# Patient Record
Sex: Male | Born: 1940 | Race: Black or African American | Hispanic: No | Marital: Married | Smoking: Never smoker
Health system: Southern US, Community
[De-identification: ages and names within clinical notes are randomized; demographics above are authoritative.]

## PROBLEM LIST (undated history)

## (undated) DIAGNOSIS — G4730 Sleep apnea, unspecified: Secondary | ICD-10-CM

## (undated) DIAGNOSIS — E7800 Pure hypercholesterolemia, unspecified: Secondary | ICD-10-CM

## (undated) DIAGNOSIS — N189 Chronic kidney disease, unspecified: Secondary | ICD-10-CM

## (undated) DIAGNOSIS — I10 Essential (primary) hypertension: Secondary | ICD-10-CM

## (undated) DIAGNOSIS — K219 Gastro-esophageal reflux disease without esophagitis: Secondary | ICD-10-CM

## (undated) DIAGNOSIS — K529 Noninfective gastroenteritis and colitis, unspecified: Secondary | ICD-10-CM

## (undated) HISTORY — DX: Pure hypercholesterolemia, unspecified: E78.00

## (undated) HISTORY — DX: Noninfective gastroenteritis and colitis, unspecified: K52.9

## (undated) HISTORY — PX: APPENDECTOMY: SHX54

## (undated) HISTORY — DX: Essential (primary) hypertension: I10

---

## 1999-11-03 DIAGNOSIS — Z87442 Personal history of urinary calculi: Secondary | ICD-10-CM

## 1999-11-03 HISTORY — DX: Personal history of urinary calculi: Z87.442

## 2002-04-06 ENCOUNTER — Ambulatory Visit (HOSPITAL_COMMUNITY): Admission: RE | Admit: 2002-04-06 | Discharge: 2002-04-06 | Payer: Self-pay | Admitting: Cardiology

## 2006-11-08 ENCOUNTER — Ambulatory Visit: Payer: Self-pay | Admitting: Cardiology

## 2010-07-24 ENCOUNTER — Encounter: Admission: RE | Admit: 2010-07-24 | Discharge: 2010-07-24 | Payer: Self-pay | Admitting: Neurosurgery

## 2011-03-20 NOTE — Cardiovascular Report (Signed)
[place]  Patient:    QUADIR, MUNS Visit Number: [id number] MRN: [id number]          Service Type: CAT Location: [facility] 2855 [id number] Attending Physician:  Jonelle Sidle Dictated by:   Jonelle Sidle, M.D. [facility] Proc. Date: 04/06/02 Admit Date:  04/06/2002 Discharge Date: 04/06/2002                          Cardiac Catheterization  DATE OF BIRTH: 10-Feb-1941  PRIMARY CARE PHYSICIAN: Colon Flattery. Ollen Bowl, M.D.  CARDIOLOGIST: Jonelle Sidle, M.D.  INDICATIONS: The patient is a 70 year old male who presents with a several week history of dyspnea on exertion and intermittent chest discomfort. He apparently was admitted to [facility] with similar symptoms recently and had, by report, a normal myocardial perfusion study with ejection fraction of 68% and chest x-ray showing mild cardiomegaly with scattered areas of lung scarring. He has had continued symptoms despite this evaluation and is now referred for left and right heart catheterization to clearly define the coronary anatomy, left ventricular contraction and hemodynamics.  PROCEDURES PERFORMED: 1. Left heart catheterization. 2. Left ventriculography. 3. Right heart catheterization with hemodynamic measurements. 4. Selective coronary angiography.  ACCESS AND EQUIPMENT: The area about the right femoral artery and vein was anesthetized with 1% lidocaine and a 6 French sheath was placed in the right femoral artery via the modified Seldinger technique. An 8 French sheath was placed in the right femoral vein via the modified Seldinger technique. Standard preformed 6 [place] and JR4 catheters were used for selective coronary angiography. A 6 French angled pigtail catheter was used for left heart catheterization and left ventriculography. A standard 7.5 French balloon-tipped flow-directed catheter was used for right heart catheterization and hemodynamics.  Exchanges were made over a wire. The patient tolerated the procedure well without complications.  HEMODYNAMICS: Right atrium 8, right ventricle 32.11, pulmonary artery 33/13, pulmonary capillary wedge pressure 9 with an A wave to 12 and a V wave without 11. Left ventricle 118/14, aorta 118/72. Cardiac output 4.4 by the Fick method, cardiac index 2.1 by the Fick method. Arterial saturation 94% on room air. Pulmonary artery saturation 66%.  ANGIOGRAPHIC FINDINGS: 1. The left main coronary artery is free of significant flow-limiting coronary    atherosclerosis. 2. The left anterior descending provides two diagonal branches, both of    which are small. There is a 40% ostial narrowing of the first diagonal    branch. No significant flow-limiting lesions are noted. 3. Circumflex coronary artery provides essentially two obtuse marginal    branches. There is a higher ramus intermedius branch as well. No    significant flow-limiting coronary atherosclerosis is noted throughout this    system. 4. The right coronary artery is a dominant vessel that provides the posterior    descending branch. No significant flow-limiting coronary atherosclerosis    is noted in this system.  LEFT VENTRICULOGRAM: Left ventriculography in the RAO position reveals an ejection fraction of approximately 70% with no focal wall motion abnormalities and no significant mitral regurgitation.  DIAGNOSES: 1. Minor coronary atherosclerosis as outlined. 2. Normal hemodynamics. 3. Left ventricular ejection fraction of approximately 70% with no    mitral regurgitation.  RECOMMENDATIONS: Will continue risk factor modification. Based on these findings, there does not appear to be a cardiac etiology for the patients symptoms. Will make a referral for formal pulmonary evaluation. Dictated by:   Remi Deter  Franky Macho, M.D. [facility] Attending Physician:  Jonelle Sidle DD:  04/06/02 TD:  04/08/02 Job: [id number]

## 2015-11-19 DIAGNOSIS — Z6829 Body mass index (BMI) 29.0-29.9, adult: Secondary | ICD-10-CM | POA: Diagnosis not present

## 2015-11-19 DIAGNOSIS — I10 Essential (primary) hypertension: Secondary | ICD-10-CM | POA: Diagnosis not present

## 2015-11-19 DIAGNOSIS — Z0000 Encounter for general adult medical examination without abnormal findings: Secondary | ICD-10-CM | POA: Diagnosis not present

## 2015-12-10 DIAGNOSIS — E7800 Pure hypercholesterolemia, unspecified: Secondary | ICD-10-CM | POA: Diagnosis not present

## 2015-12-10 DIAGNOSIS — K5730 Diverticulosis of large intestine without perforation or abscess without bleeding: Secondary | ICD-10-CM | POA: Diagnosis not present

## 2015-12-10 DIAGNOSIS — R1011 Right upper quadrant pain: Secondary | ICD-10-CM | POA: Diagnosis not present

## 2015-12-10 DIAGNOSIS — K219 Gastro-esophageal reflux disease without esophagitis: Secondary | ICD-10-CM | POA: Diagnosis not present

## 2015-12-10 DIAGNOSIS — Z79899 Other long term (current) drug therapy: Secondary | ICD-10-CM | POA: Diagnosis not present

## 2015-12-10 DIAGNOSIS — K529 Noninfective gastroenteritis and colitis, unspecified: Secondary | ICD-10-CM | POA: Diagnosis not present

## 2015-12-10 DIAGNOSIS — I10 Essential (primary) hypertension: Secondary | ICD-10-CM | POA: Diagnosis not present

## 2016-01-06 DIAGNOSIS — Z8042 Family history of malignant neoplasm of prostate: Secondary | ICD-10-CM | POA: Diagnosis not present

## 2016-01-06 DIAGNOSIS — R350 Frequency of micturition: Secondary | ICD-10-CM | POA: Diagnosis not present

## 2016-01-10 DIAGNOSIS — Z6829 Body mass index (BMI) 29.0-29.9, adult: Secondary | ICD-10-CM | POA: Diagnosis not present

## 2016-01-10 DIAGNOSIS — K210 Gastro-esophageal reflux disease with esophagitis: Secondary | ICD-10-CM | POA: Diagnosis not present

## 2016-01-10 DIAGNOSIS — I10 Essential (primary) hypertension: Secondary | ICD-10-CM | POA: Diagnosis not present

## 2016-01-10 DIAGNOSIS — N3941 Urge incontinence: Secondary | ICD-10-CM | POA: Diagnosis not present

## 2016-01-10 DIAGNOSIS — N401 Enlarged prostate with lower urinary tract symptoms: Secondary | ICD-10-CM | POA: Diagnosis not present

## 2016-01-23 DIAGNOSIS — L128 Other pemphigoid: Secondary | ICD-10-CM | POA: Diagnosis not present

## 2016-01-23 DIAGNOSIS — L280 Lichen simplex chronicus: Secondary | ICD-10-CM | POA: Diagnosis not present

## 2016-01-23 DIAGNOSIS — D485 Neoplasm of uncertain behavior of skin: Secondary | ICD-10-CM | POA: Diagnosis not present

## 2016-01-23 DIAGNOSIS — L308 Other specified dermatitis: Secondary | ICD-10-CM | POA: Diagnosis not present

## 2016-01-23 DIAGNOSIS — L299 Pruritus, unspecified: Secondary | ICD-10-CM | POA: Diagnosis not present

## 2016-01-23 DIAGNOSIS — R238 Other skin changes: Secondary | ICD-10-CM | POA: Diagnosis not present

## 2016-01-28 DIAGNOSIS — S80821A Blister (nonthermal), right lower leg, initial encounter: Secondary | ICD-10-CM | POA: Diagnosis not present

## 2016-01-28 DIAGNOSIS — D485 Neoplasm of uncertain behavior of skin: Secondary | ICD-10-CM | POA: Diagnosis not present

## 2016-02-06 DIAGNOSIS — Z79899 Other long term (current) drug therapy: Secondary | ICD-10-CM | POA: Diagnosis not present

## 2016-02-06 DIAGNOSIS — L120 Bullous pemphigoid: Secondary | ICD-10-CM | POA: Diagnosis not present

## 2016-02-06 DIAGNOSIS — L299 Pruritus, unspecified: Secondary | ICD-10-CM | POA: Diagnosis not present

## 2016-02-26 DIAGNOSIS — L299 Pruritus, unspecified: Secondary | ICD-10-CM | POA: Diagnosis not present

## 2016-02-26 DIAGNOSIS — L120 Bullous pemphigoid: Secondary | ICD-10-CM | POA: Diagnosis not present

## 2016-02-26 DIAGNOSIS — Z79899 Other long term (current) drug therapy: Secondary | ICD-10-CM | POA: Diagnosis not present

## 2016-03-11 DIAGNOSIS — R350 Frequency of micturition: Secondary | ICD-10-CM | POA: Diagnosis not present

## 2016-03-11 DIAGNOSIS — R3915 Urgency of urination: Secondary | ICD-10-CM | POA: Diagnosis not present

## 2016-03-25 DIAGNOSIS — L120 Bullous pemphigoid: Secondary | ICD-10-CM | POA: Diagnosis not present

## 2016-03-25 DIAGNOSIS — L299 Pruritus, unspecified: Secondary | ICD-10-CM | POA: Diagnosis not present

## 2016-03-25 DIAGNOSIS — Z79899 Other long term (current) drug therapy: Secondary | ICD-10-CM | POA: Diagnosis not present

## 2016-03-25 DIAGNOSIS — L218 Other seborrheic dermatitis: Secondary | ICD-10-CM | POA: Diagnosis not present

## 2016-04-09 DIAGNOSIS — Z79899 Other long term (current) drug therapy: Secondary | ICD-10-CM | POA: Diagnosis not present

## 2016-04-09 DIAGNOSIS — L120 Bullous pemphigoid: Secondary | ICD-10-CM | POA: Diagnosis not present

## 2016-04-10 DIAGNOSIS — Z79899 Other long term (current) drug therapy: Secondary | ICD-10-CM | POA: Diagnosis not present

## 2016-04-24 DIAGNOSIS — N3941 Urge incontinence: Secondary | ICD-10-CM | POA: Diagnosis not present

## 2016-04-24 DIAGNOSIS — K210 Gastro-esophageal reflux disease with esophagitis: Secondary | ICD-10-CM | POA: Diagnosis not present

## 2016-04-24 DIAGNOSIS — N401 Enlarged prostate with lower urinary tract symptoms: Secondary | ICD-10-CM | POA: Diagnosis not present

## 2016-04-24 DIAGNOSIS — N183 Chronic kidney disease, stage 3 (moderate): Secondary | ICD-10-CM | POA: Diagnosis not present

## 2016-04-24 DIAGNOSIS — I10 Essential (primary) hypertension: Secondary | ICD-10-CM | POA: Diagnosis not present

## 2016-04-24 DIAGNOSIS — Z6829 Body mass index (BMI) 29.0-29.9, adult: Secondary | ICD-10-CM | POA: Diagnosis not present

## 2016-04-29 DIAGNOSIS — N183 Chronic kidney disease, stage 3 (moderate): Secondary | ICD-10-CM | POA: Diagnosis not present

## 2016-04-29 DIAGNOSIS — I10 Essential (primary) hypertension: Secondary | ICD-10-CM | POA: Diagnosis not present

## 2016-05-11 DIAGNOSIS — L299 Pruritus, unspecified: Secondary | ICD-10-CM | POA: Diagnosis not present

## 2016-05-11 DIAGNOSIS — Z79899 Other long term (current) drug therapy: Secondary | ICD-10-CM | POA: Diagnosis not present

## 2016-05-11 DIAGNOSIS — L128 Other pemphigoid: Secondary | ICD-10-CM | POA: Diagnosis not present

## 2016-05-25 DIAGNOSIS — R51 Headache: Secondary | ICD-10-CM | POA: Diagnosis not present

## 2016-05-25 DIAGNOSIS — A419 Sepsis, unspecified organism: Secondary | ICD-10-CM | POA: Diagnosis not present

## 2016-05-25 DIAGNOSIS — R509 Fever, unspecified: Secondary | ICD-10-CM | POA: Diagnosis not present

## 2016-05-25 DIAGNOSIS — N3000 Acute cystitis without hematuria: Secondary | ICD-10-CM | POA: Diagnosis not present

## 2016-05-25 DIAGNOSIS — E785 Hyperlipidemia, unspecified: Secondary | ICD-10-CM | POA: Diagnosis not present

## 2016-05-25 DIAGNOSIS — J9811 Atelectasis: Secondary | ICD-10-CM | POA: Diagnosis not present

## 2016-05-25 DIAGNOSIS — N390 Urinary tract infection, site not specified: Secondary | ICD-10-CM | POA: Diagnosis not present

## 2016-05-25 DIAGNOSIS — N189 Chronic kidney disease, unspecified: Secondary | ICD-10-CM | POA: Diagnosis not present

## 2016-05-25 DIAGNOSIS — R109 Unspecified abdominal pain: Secondary | ICD-10-CM | POA: Diagnosis not present

## 2016-05-25 DIAGNOSIS — R3915 Urgency of urination: Secondary | ICD-10-CM | POA: Diagnosis not present

## 2016-05-25 DIAGNOSIS — E7800 Pure hypercholesterolemia, unspecified: Secondary | ICD-10-CM | POA: Diagnosis not present

## 2016-05-25 DIAGNOSIS — I129 Hypertensive chronic kidney disease with stage 1 through stage 4 chronic kidney disease, or unspecified chronic kidney disease: Secondary | ICD-10-CM | POA: Diagnosis not present

## 2016-05-25 DIAGNOSIS — E869 Volume depletion, unspecified: Secondary | ICD-10-CM | POA: Diagnosis not present

## 2016-05-25 DIAGNOSIS — E669 Obesity, unspecified: Secondary | ICD-10-CM | POA: Diagnosis not present

## 2016-05-25 DIAGNOSIS — R413 Other amnesia: Secondary | ICD-10-CM | POA: Diagnosis not present

## 2016-05-25 DIAGNOSIS — I2699 Other pulmonary embolism without acute cor pulmonale: Secondary | ICD-10-CM | POA: Diagnosis not present

## 2016-05-25 DIAGNOSIS — N400 Enlarged prostate without lower urinary tract symptoms: Secondary | ICD-10-CM | POA: Diagnosis not present

## 2016-05-25 DIAGNOSIS — R410 Disorientation, unspecified: Secondary | ICD-10-CM | POA: Diagnosis not present

## 2016-05-25 DIAGNOSIS — Z6828 Body mass index (BMI) 28.0-28.9, adult: Secondary | ICD-10-CM | POA: Diagnosis not present

## 2016-05-25 DIAGNOSIS — R0602 Shortness of breath: Secondary | ICD-10-CM | POA: Diagnosis not present

## 2016-05-25 DIAGNOSIS — R600 Localized edema: Secondary | ICD-10-CM | POA: Diagnosis not present

## 2016-05-25 DIAGNOSIS — N281 Cyst of kidney, acquired: Secondary | ICD-10-CM | POA: Diagnosis not present

## 2016-05-25 DIAGNOSIS — I517 Cardiomegaly: Secondary | ICD-10-CM | POA: Diagnosis not present

## 2016-05-26 DIAGNOSIS — N281 Cyst of kidney, acquired: Secondary | ICD-10-CM | POA: Diagnosis not present

## 2016-05-26 DIAGNOSIS — R51 Headache: Secondary | ICD-10-CM | POA: Diagnosis not present

## 2016-05-26 DIAGNOSIS — R0602 Shortness of breath: Secondary | ICD-10-CM | POA: Diagnosis not present

## 2016-05-27 DIAGNOSIS — I517 Cardiomegaly: Secondary | ICD-10-CM | POA: Diagnosis not present

## 2016-05-28 DIAGNOSIS — N390 Urinary tract infection, site not specified: Secondary | ICD-10-CM | POA: Diagnosis not present

## 2016-05-28 DIAGNOSIS — R600 Localized edema: Secondary | ICD-10-CM | POA: Diagnosis not present

## 2016-05-28 DIAGNOSIS — R3915 Urgency of urination: Secondary | ICD-10-CM | POA: Diagnosis not present

## 2016-06-02 DIAGNOSIS — I10 Essential (primary) hypertension: Secondary | ICD-10-CM | POA: Diagnosis not present

## 2016-06-02 DIAGNOSIS — I2699 Other pulmonary embolism without acute cor pulmonale: Secondary | ICD-10-CM | POA: Diagnosis not present

## 2016-06-02 DIAGNOSIS — N3001 Acute cystitis with hematuria: Secondary | ICD-10-CM | POA: Diagnosis not present

## 2016-06-02 DIAGNOSIS — N401 Enlarged prostate with lower urinary tract symptoms: Secondary | ICD-10-CM | POA: Diagnosis not present

## 2016-06-02 DIAGNOSIS — K210 Gastro-esophageal reflux disease with esophagitis: Secondary | ICD-10-CM | POA: Diagnosis not present

## 2016-06-02 DIAGNOSIS — Z6828 Body mass index (BMI) 28.0-28.9, adult: Secondary | ICD-10-CM | POA: Diagnosis not present

## 2016-06-02 DIAGNOSIS — N183 Chronic kidney disease, stage 3 (moderate): Secondary | ICD-10-CM | POA: Diagnosis not present

## 2016-06-08 DIAGNOSIS — R079 Chest pain, unspecified: Secondary | ICD-10-CM | POA: Diagnosis not present

## 2016-06-08 DIAGNOSIS — N189 Chronic kidney disease, unspecified: Secondary | ICD-10-CM | POA: Diagnosis not present

## 2016-06-08 DIAGNOSIS — R0789 Other chest pain: Secondary | ICD-10-CM | POA: Diagnosis not present

## 2016-06-08 DIAGNOSIS — K219 Gastro-esophageal reflux disease without esophagitis: Secondary | ICD-10-CM | POA: Diagnosis not present

## 2016-06-08 DIAGNOSIS — E785 Hyperlipidemia, unspecified: Secondary | ICD-10-CM | POA: Diagnosis not present

## 2016-06-08 DIAGNOSIS — Z79899 Other long term (current) drug therapy: Secondary | ICD-10-CM | POA: Diagnosis not present

## 2016-06-08 DIAGNOSIS — N182 Chronic kidney disease, stage 2 (mild): Secondary | ICD-10-CM | POA: Diagnosis not present

## 2016-06-08 DIAGNOSIS — I129 Hypertensive chronic kidney disease with stage 1 through stage 4 chronic kidney disease, or unspecified chronic kidney disease: Secondary | ICD-10-CM | POA: Diagnosis not present

## 2016-06-08 DIAGNOSIS — R0602 Shortness of breath: Secondary | ICD-10-CM | POA: Diagnosis not present

## 2016-06-08 DIAGNOSIS — Z7902 Long term (current) use of antithrombotics/antiplatelets: Secondary | ICD-10-CM | POA: Diagnosis not present

## 2016-06-08 DIAGNOSIS — E784 Other hyperlipidemia: Secondary | ICD-10-CM | POA: Diagnosis not present

## 2016-06-08 DIAGNOSIS — I2699 Other pulmonary embolism without acute cor pulmonale: Secondary | ICD-10-CM | POA: Diagnosis not present

## 2016-06-09 DIAGNOSIS — N182 Chronic kidney disease, stage 2 (mild): Secondary | ICD-10-CM | POA: Diagnosis not present

## 2016-06-09 DIAGNOSIS — K219 Gastro-esophageal reflux disease without esophagitis: Secondary | ICD-10-CM | POA: Diagnosis not present

## 2016-06-09 DIAGNOSIS — R0789 Other chest pain: Secondary | ICD-10-CM | POA: Diagnosis not present

## 2016-06-09 DIAGNOSIS — E784 Other hyperlipidemia: Secondary | ICD-10-CM | POA: Diagnosis not present

## 2016-06-19 DIAGNOSIS — L2389 Allergic contact dermatitis due to other agents: Secondary | ICD-10-CM | POA: Diagnosis not present

## 2016-06-19 DIAGNOSIS — I10 Essential (primary) hypertension: Secondary | ICD-10-CM | POA: Diagnosis not present

## 2016-06-19 DIAGNOSIS — N183 Chronic kidney disease, stage 3 (moderate): Secondary | ICD-10-CM | POA: Diagnosis not present

## 2016-06-19 DIAGNOSIS — Z6828 Body mass index (BMI) 28.0-28.9, adult: Secondary | ICD-10-CM | POA: Diagnosis not present

## 2016-06-19 DIAGNOSIS — I2782 Chronic pulmonary embolism: Secondary | ICD-10-CM | POA: Diagnosis not present

## 2016-06-29 DIAGNOSIS — R0600 Dyspnea, unspecified: Secondary | ICD-10-CM | POA: Diagnosis not present

## 2016-06-29 DIAGNOSIS — R079 Chest pain, unspecified: Secondary | ICD-10-CM | POA: Diagnosis not present

## 2016-06-29 DIAGNOSIS — D649 Anemia, unspecified: Secondary | ICD-10-CM | POA: Diagnosis not present

## 2016-06-29 DIAGNOSIS — J189 Pneumonia, unspecified organism: Secondary | ICD-10-CM | POA: Diagnosis not present

## 2016-06-29 DIAGNOSIS — J019 Acute sinusitis, unspecified: Secondary | ICD-10-CM | POA: Diagnosis not present

## 2016-06-29 DIAGNOSIS — N19 Unspecified kidney failure: Secondary | ICD-10-CM | POA: Diagnosis not present

## 2016-06-29 DIAGNOSIS — J029 Acute pharyngitis, unspecified: Secondary | ICD-10-CM | POA: Diagnosis not present

## 2016-06-29 DIAGNOSIS — Z0131 Encounter for examination of blood pressure with abnormal findings: Secondary | ICD-10-CM | POA: Diagnosis not present

## 2016-06-29 DIAGNOSIS — R0602 Shortness of breath: Secondary | ICD-10-CM | POA: Diagnosis not present

## 2016-06-29 DIAGNOSIS — J209 Acute bronchitis, unspecified: Secondary | ICD-10-CM | POA: Diagnosis not present

## 2016-07-02 DIAGNOSIS — R0600 Dyspnea, unspecified: Secondary | ICD-10-CM | POA: Diagnosis not present

## 2016-07-02 DIAGNOSIS — R079 Chest pain, unspecified: Secondary | ICD-10-CM | POA: Diagnosis not present

## 2016-07-02 DIAGNOSIS — R0602 Shortness of breath: Secondary | ICD-10-CM | POA: Diagnosis not present

## 2016-08-10 DIAGNOSIS — R69 Illness, unspecified: Secondary | ICD-10-CM | POA: Diagnosis not present

## 2016-08-13 DIAGNOSIS — N183 Chronic kidney disease, stage 3 (moderate): Secondary | ICD-10-CM | POA: Diagnosis not present

## 2016-08-13 DIAGNOSIS — Z6828 Body mass index (BMI) 28.0-28.9, adult: Secondary | ICD-10-CM | POA: Diagnosis not present

## 2016-08-13 DIAGNOSIS — I10 Essential (primary) hypertension: Secondary | ICD-10-CM | POA: Diagnosis not present

## 2016-08-13 DIAGNOSIS — K251 Acute gastric ulcer with perforation: Secondary | ICD-10-CM | POA: Diagnosis not present

## 2016-08-13 DIAGNOSIS — I2782 Chronic pulmonary embolism: Secondary | ICD-10-CM | POA: Diagnosis not present

## 2016-09-09 DIAGNOSIS — Z6828 Body mass index (BMI) 28.0-28.9, adult: Secondary | ICD-10-CM | POA: Diagnosis not present

## 2016-09-09 DIAGNOSIS — I208 Other forms of angina pectoris: Secondary | ICD-10-CM | POA: Diagnosis not present

## 2016-09-15 DIAGNOSIS — I209 Angina pectoris, unspecified: Secondary | ICD-10-CM | POA: Diagnosis not present

## 2016-09-15 DIAGNOSIS — I208 Other forms of angina pectoris: Secondary | ICD-10-CM | POA: Diagnosis not present

## 2016-11-13 DIAGNOSIS — I10 Essential (primary) hypertension: Secondary | ICD-10-CM | POA: Diagnosis not present

## 2016-11-13 DIAGNOSIS — I208 Other forms of angina pectoris: Secondary | ICD-10-CM | POA: Diagnosis not present

## 2016-11-13 DIAGNOSIS — N182 Chronic kidney disease, stage 2 (mild): Secondary | ICD-10-CM | POA: Diagnosis not present

## 2016-11-13 DIAGNOSIS — R0609 Other forms of dyspnea: Secondary | ICD-10-CM | POA: Diagnosis not present

## 2016-11-13 DIAGNOSIS — Z6829 Body mass index (BMI) 29.0-29.9, adult: Secondary | ICD-10-CM | POA: Diagnosis not present

## 2016-11-16 DIAGNOSIS — N182 Chronic kidney disease, stage 2 (mild): Secondary | ICD-10-CM | POA: Diagnosis not present

## 2016-11-23 DIAGNOSIS — R1084 Generalized abdominal pain: Secondary | ICD-10-CM | POA: Diagnosis not present

## 2016-12-10 DIAGNOSIS — I10 Essential (primary) hypertension: Secondary | ICD-10-CM | POA: Diagnosis not present

## 2016-12-10 DIAGNOSIS — Z7982 Long term (current) use of aspirin: Secondary | ICD-10-CM | POA: Diagnosis not present

## 2016-12-10 DIAGNOSIS — R1084 Generalized abdominal pain: Secondary | ICD-10-CM | POA: Diagnosis not present

## 2016-12-10 DIAGNOSIS — Z79899 Other long term (current) drug therapy: Secondary | ICD-10-CM | POA: Diagnosis not present

## 2016-12-10 DIAGNOSIS — K2950 Unspecified chronic gastritis without bleeding: Secondary | ICD-10-CM | POA: Diagnosis not present

## 2016-12-10 DIAGNOSIS — R109 Unspecified abdominal pain: Secondary | ICD-10-CM | POA: Diagnosis not present

## 2016-12-10 DIAGNOSIS — R140 Abdominal distension (gaseous): Secondary | ICD-10-CM | POA: Diagnosis not present

## 2016-12-10 DIAGNOSIS — Z7901 Long term (current) use of anticoagulants: Secondary | ICD-10-CM | POA: Diagnosis not present

## 2016-12-10 DIAGNOSIS — D120 Benign neoplasm of cecum: Secondary | ICD-10-CM | POA: Diagnosis not present

## 2016-12-10 DIAGNOSIS — B9681 Helicobacter pylori [H. pylori] as the cause of diseases classified elsewhere: Secondary | ICD-10-CM | POA: Diagnosis not present

## 2016-12-10 DIAGNOSIS — K922 Gastrointestinal hemorrhage, unspecified: Secondary | ICD-10-CM | POA: Diagnosis not present

## 2016-12-10 DIAGNOSIS — K635 Polyp of colon: Secondary | ICD-10-CM | POA: Diagnosis not present

## 2016-12-10 DIAGNOSIS — K219 Gastro-esophageal reflux disease without esophagitis: Secondary | ICD-10-CM | POA: Diagnosis not present

## 2016-12-22 DIAGNOSIS — J019 Acute sinusitis, unspecified: Secondary | ICD-10-CM | POA: Diagnosis not present

## 2016-12-22 DIAGNOSIS — R05 Cough: Secondary | ICD-10-CM | POA: Diagnosis not present

## 2016-12-22 DIAGNOSIS — J180 Bronchopneumonia, unspecified organism: Secondary | ICD-10-CM | POA: Diagnosis not present

## 2016-12-22 DIAGNOSIS — I959 Hypotension, unspecified: Secondary | ICD-10-CM | POA: Diagnosis not present

## 2016-12-22 DIAGNOSIS — J029 Acute pharyngitis, unspecified: Secondary | ICD-10-CM | POA: Diagnosis not present

## 2016-12-22 DIAGNOSIS — B349 Viral infection, unspecified: Secondary | ICD-10-CM | POA: Diagnosis not present

## 2016-12-23 DIAGNOSIS — R05 Cough: Secondary | ICD-10-CM | POA: Diagnosis not present

## 2016-12-23 DIAGNOSIS — J180 Bronchopneumonia, unspecified organism: Secondary | ICD-10-CM | POA: Diagnosis not present

## 2016-12-23 DIAGNOSIS — B349 Viral infection, unspecified: Secondary | ICD-10-CM | POA: Diagnosis not present

## 2017-01-27 DIAGNOSIS — E7800 Pure hypercholesterolemia, unspecified: Secondary | ICD-10-CM | POA: Diagnosis not present

## 2017-01-27 DIAGNOSIS — I10 Essential (primary) hypertension: Secondary | ICD-10-CM | POA: Diagnosis not present

## 2017-01-27 DIAGNOSIS — N401 Enlarged prostate with lower urinary tract symptoms: Secondary | ICD-10-CM | POA: Diagnosis not present

## 2017-01-27 DIAGNOSIS — K219 Gastro-esophageal reflux disease without esophagitis: Secondary | ICD-10-CM | POA: Diagnosis not present

## 2017-01-27 DIAGNOSIS — Z0000 Encounter for general adult medical examination without abnormal findings: Secondary | ICD-10-CM | POA: Diagnosis not present

## 2017-01-27 DIAGNOSIS — Z7982 Long term (current) use of aspirin: Secondary | ICD-10-CM | POA: Diagnosis not present

## 2017-01-27 DIAGNOSIS — T7840XD Allergy, unspecified, subsequent encounter: Secondary | ICD-10-CM | POA: Diagnosis not present

## 2017-01-27 DIAGNOSIS — Z6829 Body mass index (BMI) 29.0-29.9, adult: Secondary | ICD-10-CM | POA: Diagnosis not present

## 2017-02-09 DIAGNOSIS — R1084 Generalized abdominal pain: Secondary | ICD-10-CM | POA: Diagnosis not present

## 2017-02-12 DIAGNOSIS — K2960 Other gastritis without bleeding: Secondary | ICD-10-CM | POA: Diagnosis not present

## 2017-02-12 DIAGNOSIS — I208 Other forms of angina pectoris: Secondary | ICD-10-CM | POA: Diagnosis not present

## 2017-02-12 DIAGNOSIS — N182 Chronic kidney disease, stage 2 (mild): Secondary | ICD-10-CM | POA: Diagnosis not present

## 2017-02-12 DIAGNOSIS — Z0000 Encounter for general adult medical examination without abnormal findings: Secondary | ICD-10-CM | POA: Diagnosis not present

## 2017-02-12 DIAGNOSIS — I10 Essential (primary) hypertension: Secondary | ICD-10-CM | POA: Diagnosis not present

## 2017-02-12 DIAGNOSIS — R0609 Other forms of dyspnea: Secondary | ICD-10-CM | POA: Diagnosis not present

## 2017-02-12 DIAGNOSIS — Z6828 Body mass index (BMI) 28.0-28.9, adult: Secondary | ICD-10-CM | POA: Diagnosis not present

## 2017-02-12 DIAGNOSIS — Z1389 Encounter for screening for other disorder: Secondary | ICD-10-CM | POA: Diagnosis not present

## 2017-02-15 DIAGNOSIS — N289 Disorder of kidney and ureter, unspecified: Secondary | ICD-10-CM | POA: Diagnosis not present

## 2017-02-21 DIAGNOSIS — K5909 Other constipation: Secondary | ICD-10-CM | POA: Diagnosis not present

## 2017-02-21 DIAGNOSIS — Z7982 Long term (current) use of aspirin: Secondary | ICD-10-CM | POA: Diagnosis not present

## 2017-02-21 DIAGNOSIS — R0789 Other chest pain: Secondary | ICD-10-CM | POA: Diagnosis not present

## 2017-02-21 DIAGNOSIS — K5900 Constipation, unspecified: Secondary | ICD-10-CM | POA: Diagnosis not present

## 2017-02-21 DIAGNOSIS — N400 Enlarged prostate without lower urinary tract symptoms: Secondary | ICD-10-CM | POA: Diagnosis not present

## 2017-02-21 DIAGNOSIS — Z86711 Personal history of pulmonary embolism: Secondary | ICD-10-CM | POA: Diagnosis not present

## 2017-02-21 DIAGNOSIS — B370 Candidal stomatitis: Secondary | ICD-10-CM | POA: Diagnosis not present

## 2017-02-21 DIAGNOSIS — R509 Fever, unspecified: Secondary | ICD-10-CM | POA: Diagnosis not present

## 2017-02-21 DIAGNOSIS — E785 Hyperlipidemia, unspecified: Secondary | ICD-10-CM | POA: Diagnosis not present

## 2017-02-21 DIAGNOSIS — N183 Chronic kidney disease, stage 3 (moderate): Secondary | ICD-10-CM | POA: Diagnosis not present

## 2017-02-21 DIAGNOSIS — R05 Cough: Secondary | ICD-10-CM | POA: Diagnosis not present

## 2017-02-21 DIAGNOSIS — K5792 Diverticulitis of intestine, part unspecified, without perforation or abscess without bleeding: Secondary | ICD-10-CM | POA: Diagnosis not present

## 2017-02-21 DIAGNOSIS — K5790 Diverticulosis of intestine, part unspecified, without perforation or abscess without bleeding: Secondary | ICD-10-CM | POA: Diagnosis not present

## 2017-02-21 DIAGNOSIS — R079 Chest pain, unspecified: Secondary | ICD-10-CM | POA: Diagnosis not present

## 2017-02-21 DIAGNOSIS — K219 Gastro-esophageal reflux disease without esophagitis: Secondary | ICD-10-CM | POA: Diagnosis not present

## 2017-02-21 DIAGNOSIS — K5732 Diverticulitis of large intestine without perforation or abscess without bleeding: Secondary | ICD-10-CM | POA: Diagnosis not present

## 2017-02-21 DIAGNOSIS — I129 Hypertensive chronic kidney disease with stage 1 through stage 4 chronic kidney disease, or unspecified chronic kidney disease: Secondary | ICD-10-CM | POA: Diagnosis not present

## 2017-03-04 DIAGNOSIS — I10 Essential (primary) hypertension: Secondary | ICD-10-CM | POA: Diagnosis not present

## 2017-03-04 DIAGNOSIS — K51511 Left sided colitis with rectal bleeding: Secondary | ICD-10-CM | POA: Diagnosis not present

## 2017-03-04 DIAGNOSIS — Z6829 Body mass index (BMI) 29.0-29.9, adult: Secondary | ICD-10-CM | POA: Diagnosis not present

## 2017-03-24 DIAGNOSIS — A048 Other specified bacterial intestinal infections: Secondary | ICD-10-CM | POA: Diagnosis not present

## 2017-04-16 DIAGNOSIS — Z6829 Body mass index (BMI) 29.0-29.9, adult: Secondary | ICD-10-CM | POA: Diagnosis not present

## 2017-04-16 DIAGNOSIS — L2089 Other atopic dermatitis: Secondary | ICD-10-CM | POA: Diagnosis not present

## 2017-04-16 DIAGNOSIS — I10 Essential (primary) hypertension: Secondary | ICD-10-CM | POA: Diagnosis not present

## 2017-07-05 DIAGNOSIS — R0602 Shortness of breath: Secondary | ICD-10-CM | POA: Diagnosis not present

## 2017-07-05 DIAGNOSIS — N400 Enlarged prostate without lower urinary tract symptoms: Secondary | ICD-10-CM | POA: Diagnosis not present

## 2017-07-05 DIAGNOSIS — N183 Chronic kidney disease, stage 3 (moderate): Secondary | ICD-10-CM | POA: Diagnosis not present

## 2017-07-05 DIAGNOSIS — Z86711 Personal history of pulmonary embolism: Secondary | ICD-10-CM | POA: Diagnosis not present

## 2017-07-05 DIAGNOSIS — R079 Chest pain, unspecified: Secondary | ICD-10-CM | POA: Diagnosis not present

## 2017-07-05 DIAGNOSIS — K219 Gastro-esophageal reflux disease without esophagitis: Secondary | ICD-10-CM | POA: Diagnosis not present

## 2017-07-05 DIAGNOSIS — Z7982 Long term (current) use of aspirin: Secondary | ICD-10-CM | POA: Diagnosis not present

## 2017-07-05 DIAGNOSIS — Z79899 Other long term (current) drug therapy: Secondary | ICD-10-CM | POA: Diagnosis not present

## 2017-07-05 DIAGNOSIS — E785 Hyperlipidemia, unspecified: Secondary | ICD-10-CM | POA: Diagnosis not present

## 2017-07-05 DIAGNOSIS — I129 Hypertensive chronic kidney disease with stage 1 through stage 4 chronic kidney disease, or unspecified chronic kidney disease: Secondary | ICD-10-CM | POA: Diagnosis not present

## 2017-07-05 DIAGNOSIS — E784 Other hyperlipidemia: Secondary | ICD-10-CM | POA: Diagnosis not present

## 2017-07-05 DIAGNOSIS — R0789 Other chest pain: Secondary | ICD-10-CM | POA: Diagnosis not present

## 2017-07-06 DIAGNOSIS — R0789 Other chest pain: Secondary | ICD-10-CM | POA: Diagnosis not present

## 2017-07-06 DIAGNOSIS — K219 Gastro-esophageal reflux disease without esophagitis: Secondary | ICD-10-CM | POA: Diagnosis not present

## 2017-07-06 DIAGNOSIS — N183 Chronic kidney disease, stage 3 (moderate): Secondary | ICD-10-CM | POA: Diagnosis not present

## 2017-07-06 DIAGNOSIS — E784 Other hyperlipidemia: Secondary | ICD-10-CM | POA: Diagnosis not present

## 2017-07-12 DIAGNOSIS — Z6828 Body mass index (BMI) 28.0-28.9, adult: Secondary | ICD-10-CM | POA: Diagnosis not present

## 2017-07-12 DIAGNOSIS — N182 Chronic kidney disease, stage 2 (mild): Secondary | ICD-10-CM | POA: Diagnosis not present

## 2017-07-12 DIAGNOSIS — I208 Other forms of angina pectoris: Secondary | ICD-10-CM | POA: Diagnosis not present

## 2017-08-17 DIAGNOSIS — R69 Illness, unspecified: Secondary | ICD-10-CM | POA: Diagnosis not present

## 2017-09-21 ENCOUNTER — Ambulatory Visit: Payer: Medicare HMO | Admitting: Urology

## 2017-09-21 DIAGNOSIS — N5201 Erectile dysfunction due to arterial insufficiency: Secondary | ICD-10-CM

## 2017-09-21 DIAGNOSIS — R3915 Urgency of urination: Secondary | ICD-10-CM | POA: Diagnosis not present

## 2017-09-21 DIAGNOSIS — N430 Encysted hydrocele: Secondary | ICD-10-CM

## 2017-10-06 DIAGNOSIS — J209 Acute bronchitis, unspecified: Secondary | ICD-10-CM | POA: Diagnosis not present

## 2017-10-06 DIAGNOSIS — J019 Acute sinusitis, unspecified: Secondary | ICD-10-CM | POA: Diagnosis not present

## 2017-10-06 DIAGNOSIS — J029 Acute pharyngitis, unspecified: Secondary | ICD-10-CM | POA: Diagnosis not present

## 2017-10-15 DIAGNOSIS — K219 Gastro-esophageal reflux disease without esophagitis: Secondary | ICD-10-CM | POA: Diagnosis not present

## 2017-10-15 DIAGNOSIS — I10 Essential (primary) hypertension: Secondary | ICD-10-CM | POA: Diagnosis not present

## 2017-10-15 DIAGNOSIS — Z6828 Body mass index (BMI) 28.0-28.9, adult: Secondary | ICD-10-CM | POA: Diagnosis not present

## 2017-10-15 DIAGNOSIS — N182 Chronic kidney disease, stage 2 (mild): Secondary | ICD-10-CM | POA: Diagnosis not present

## 2017-11-15 ENCOUNTER — Encounter (INDEPENDENT_AMBULATORY_CARE_PROVIDER_SITE_OTHER): Payer: Self-pay

## 2017-11-15 ENCOUNTER — Encounter (INDEPENDENT_AMBULATORY_CARE_PROVIDER_SITE_OTHER): Payer: Self-pay | Admitting: Internal Medicine

## 2017-11-25 ENCOUNTER — Ambulatory Visit (INDEPENDENT_AMBULATORY_CARE_PROVIDER_SITE_OTHER): Payer: Medicare HMO | Admitting: Internal Medicine

## 2017-12-07 ENCOUNTER — Ambulatory Visit (INDEPENDENT_AMBULATORY_CARE_PROVIDER_SITE_OTHER): Payer: Medicare PPO | Admitting: Internal Medicine

## 2017-12-07 ENCOUNTER — Encounter (INDEPENDENT_AMBULATORY_CARE_PROVIDER_SITE_OTHER): Payer: Self-pay | Admitting: Internal Medicine

## 2017-12-07 DIAGNOSIS — K529 Noninfective gastroenteritis and colitis, unspecified: Secondary | ICD-10-CM

## 2017-12-07 DIAGNOSIS — E7800 Pure hypercholesterolemia, unspecified: Secondary | ICD-10-CM | POA: Insufficient documentation

## 2017-12-07 DIAGNOSIS — I10 Essential (primary) hypertension: Secondary | ICD-10-CM | POA: Insufficient documentation

## 2017-12-07 LAB — COMPREHENSIVE METABOLIC PANEL
AG Ratio: 1.2 (calc) (ref 1.0–2.5)
ALT: 11 U/L (ref 9–46)
AST: 13 U/L (ref 10–35)
Albumin: 3.9 g/dL (ref 3.6–5.1)
Alkaline phosphatase (APISO): 67 U/L (ref 40–115)
BILIRUBIN TOTAL: 0.4 mg/dL (ref 0.2–1.2)
BUN/Creatinine Ratio: 19 (calc) (ref 6–22)
BUN: 37 mg/dL — AB (ref 7–25)
CALCIUM: 9.8 mg/dL (ref 8.6–10.3)
CO2: 24 mmol/L (ref 20–32)
Chloride: 110 mmol/L (ref 98–110)
Creat: 1.99 mg/dL — ABNORMAL HIGH (ref 0.70–1.18)
Globulin: 3.3 g/dL (calc) (ref 1.9–3.7)
Glucose, Bld: 88 mg/dL (ref 65–99)
Potassium: 4.3 mmol/L (ref 3.5–5.3)
SODIUM: 141 mmol/L (ref 135–146)
TOTAL PROTEIN: 7.2 g/dL (ref 6.1–8.1)

## 2017-12-07 LAB — CBC WITH DIFFERENTIAL/PLATELET
BASOS ABS: 67 {cells}/uL (ref 0–200)
Basophils Relative: 0.7 %
EOS PCT: 1.7 %
Eosinophils Absolute: 162 cells/uL (ref 15–500)
HCT: 40 % (ref 38.5–50.0)
Hemoglobin: 13.7 g/dL (ref 13.2–17.1)
LYMPHS ABS: 2128 {cells}/uL (ref 850–3900)
MCH: 31.1 pg (ref 27.0–33.0)
MCHC: 34.3 g/dL (ref 32.0–36.0)
MCV: 90.7 fL (ref 80.0–100.0)
MPV: 9.3 fL (ref 7.5–12.5)
Monocytes Relative: 11.4 %
NEUTROS ABS: 6061 {cells}/uL (ref 1500–7800)
Neutrophils Relative %: 63.8 %
Platelets: 319 10*3/uL (ref 140–400)
RBC: 4.41 10*6/uL (ref 4.20–5.80)
RDW: 13 % (ref 11.0–15.0)
Total Lymphocyte: 22.4 %
WBC mixed population: 1083 cells/uL — ABNORMAL HIGH (ref 200–950)
WBC: 9.5 10*3/uL (ref 3.8–10.8)

## 2017-12-07 NOTE — Progress Notes (Addendum)
Subjective:    Patient ID: Joshua Farmer, male    DOB: 1941/07/24, 77 y.o.   MRN: [id number]  HPI Referred by Dr. Sherrie Sport for abdominal pain/coliltis. Admitted to [facility] in January of this year for colitis, etiology most likely viral in orgin. His stool culture was negative for c-diff, campylobacter, or salmonella. CT scan was negative for diverticulitis. He had LLQ pain. He also had diarrhea, fever (103), chills, nausea and vomiting He tells me he had diverticulitis. He was discharged with antibiotics. He states he had a colonoscopy 1 yr ago and he reports it was normal (Dr. Ladona Horns).  He also states he was treated for a stomach infection and was given antibiotics (?H. Pylori).  Also had an EGD which was normal.  He followed up with DR. Hasanaj 11/10/2016 and states he had some black stools. He says his stools are still black. No black stools in 3 weeks. He had been taking Pepto BIsmol.  His stool card with Dr Sherrie Sport was negative. Stool card while in hospital was normal.  He says he feels pretty good today.  He is on a stool softener. He has 1-3 stools a day. Stools are normal now. He says he is 80% better. He is not having any diarrhea. Slight lower abdominal pain.  Appetite is better.  He does have some acid reflux.   Hx of renal insufficiency.   11/05/2017 H and H 13.6 and 42.3, WBC 10.9.   11/05/2017 CT abdomen/pelvis wo cm: N,V, Diarrhea x 3 days.  No acute abdominal or pelvic pathology.   12/10/2016 EGD: abdominal pain/GERD Gastritis was found Positive for H. Pylori.  12/08/2016 Colonoscopy: abdominal pain:  Sessile polyp found in cecum. Polypectomy performed. Biopsy: tubular adenoma. No high g rade dysplasia. Review of Systems Past Medical History:  Diagnosis Date  . Gastroenteritis   . High cholesterol   . Hypertension       No Known Allergies  Current Outpatient Medications on File Prior to Visit  Medication Sig Dispense Refill  . aspirin EC 81 MG tablet Take 81 mg by mouth  daily.    . fesoterodine (TOVIAZ) 4 MG TB24 tablet Take 4 mg by mouth daily.    . finasteride (PROSCAR) 5 MG tablet Take 5 mg by mouth daily.    . metoprolol succinate (TOPROL-XL) 50 MG 24 hr tablet Take 50 mg by mouth daily. Take with or immediately following a meal.    . Multiple Vitamin (MULTIVITAMIN) tablet Take 1 tablet by mouth daily.    . pantoprazole (PROTONIX) 40 MG tablet Take 40 mg by mouth daily.    . rosuvastatin (CRESTOR) 10 MG tablet Take 10 mg by mouth daily.     No current facility-administered medications on file prior to visit.         Objective:   Physical Exam Blood pressure (!) 150/90, pulse 60, temperature 98 F (36.7 C), height 6' (1.829 m), weight 210 lb (95.3 kg).  Alert and oriented. Skin warm and dry. Oral mucosa is moist.   . Sclera anicteric, conjunctivae is pink. Thyroid not enlarged. No cervical lymphadenopathy. Lungs clear. Heart regular rate and rhythm.  Abdomen is soft. Bowel sounds are positive. No hepatomegaly. No abdominal masses felt. No tenderness.  No edema to lower extremities.  Stool brown and guaiac negative. No rectal masses felt.        Assessment & Plan:  Probably viral gastroenteritis/colitis. Stools stools were negative. 80% better.  Will get a CBC and CMET. today.

## 2017-12-07 NOTE — Patient Instructions (Signed)
CBC and CMET 

## 2018-04-18 DIAGNOSIS — N401 Enlarged prostate with lower urinary tract symptoms: Secondary | ICD-10-CM | POA: Diagnosis not present

## 2018-04-18 DIAGNOSIS — I10 Essential (primary) hypertension: Secondary | ICD-10-CM | POA: Diagnosis not present

## 2018-04-18 DIAGNOSIS — Z0000 Encounter for general adult medical examination without abnormal findings: Secondary | ICD-10-CM | POA: Diagnosis not present

## 2018-04-18 DIAGNOSIS — Z1389 Encounter for screening for other disorder: Secondary | ICD-10-CM | POA: Diagnosis not present

## 2018-04-18 DIAGNOSIS — Z6829 Body mass index (BMI) 29.0-29.9, adult: Secondary | ICD-10-CM | POA: Diagnosis not present

## 2018-04-18 DIAGNOSIS — K210 Gastro-esophageal reflux disease with esophagitis: Secondary | ICD-10-CM | POA: Diagnosis not present

## 2018-04-19 DIAGNOSIS — H169 Unspecified keratitis: Secondary | ICD-10-CM | POA: Diagnosis not present

## 2018-05-11 DIAGNOSIS — H169 Unspecified keratitis: Secondary | ICD-10-CM | POA: Diagnosis not present

## 2018-07-19 DIAGNOSIS — Z0000 Encounter for general adult medical examination without abnormal findings: Secondary | ICD-10-CM | POA: Diagnosis not present

## 2018-07-19 DIAGNOSIS — Z6829 Body mass index (BMI) 29.0-29.9, adult: Secondary | ICD-10-CM | POA: Diagnosis not present

## 2018-09-20 ENCOUNTER — Ambulatory Visit: Payer: Medicare PPO | Admitting: Urology

## 2018-09-28 DIAGNOSIS — H25011 Cortical age-related cataract, right eye: Secondary | ICD-10-CM | POA: Diagnosis not present

## 2018-09-28 DIAGNOSIS — H25013 Cortical age-related cataract, bilateral: Secondary | ICD-10-CM | POA: Diagnosis not present

## 2018-09-28 DIAGNOSIS — H2513 Age-related nuclear cataract, bilateral: Secondary | ICD-10-CM | POA: Diagnosis not present

## 2018-09-28 DIAGNOSIS — H40033 Anatomical narrow angle, bilateral: Secondary | ICD-10-CM | POA: Diagnosis not present

## 2018-09-28 DIAGNOSIS — H2511 Age-related nuclear cataract, right eye: Secondary | ICD-10-CM | POA: Diagnosis not present

## 2018-09-28 DIAGNOSIS — H35363 Drusen (degenerative) of macula, bilateral: Secondary | ICD-10-CM | POA: Diagnosis not present

## 2018-10-11 DIAGNOSIS — H25811 Combined forms of age-related cataract, right eye: Secondary | ICD-10-CM | POA: Diagnosis not present

## 2018-10-11 DIAGNOSIS — H2511 Age-related nuclear cataract, right eye: Secondary | ICD-10-CM | POA: Diagnosis not present

## 2018-10-18 DIAGNOSIS — N400 Enlarged prostate without lower urinary tract symptoms: Secondary | ICD-10-CM | POA: Diagnosis not present

## 2018-10-18 DIAGNOSIS — I10 Essential (primary) hypertension: Secondary | ICD-10-CM | POA: Diagnosis not present

## 2018-10-18 DIAGNOSIS — Z6828 Body mass index (BMI) 28.0-28.9, adult: Secondary | ICD-10-CM | POA: Diagnosis not present

## 2018-10-21 DIAGNOSIS — N400 Enlarged prostate without lower urinary tract symptoms: Secondary | ICD-10-CM | POA: Diagnosis not present

## 2018-10-21 DIAGNOSIS — I10 Essential (primary) hypertension: Secondary | ICD-10-CM | POA: Diagnosis not present

## 2018-10-21 DIAGNOSIS — Z6828 Body mass index (BMI) 28.0-28.9, adult: Secondary | ICD-10-CM | POA: Diagnosis not present

## 2018-10-21 DIAGNOSIS — Z125 Encounter for screening for malignant neoplasm of prostate: Secondary | ICD-10-CM | POA: Diagnosis not present

## 2018-10-21 DIAGNOSIS — E782 Mixed hyperlipidemia: Secondary | ICD-10-CM | POA: Diagnosis not present

## 2018-10-27 DIAGNOSIS — H25012 Cortical age-related cataract, left eye: Secondary | ICD-10-CM | POA: Diagnosis not present

## 2018-10-27 DIAGNOSIS — H2512 Age-related nuclear cataract, left eye: Secondary | ICD-10-CM | POA: Diagnosis not present

## 2018-11-08 DIAGNOSIS — H2512 Age-related nuclear cataract, left eye: Secondary | ICD-10-CM | POA: Diagnosis not present

## 2018-11-08 DIAGNOSIS — H25812 Combined forms of age-related cataract, left eye: Secondary | ICD-10-CM | POA: Diagnosis not present

## 2018-11-11 DIAGNOSIS — I10 Essential (primary) hypertension: Secondary | ICD-10-CM | POA: Diagnosis not present

## 2018-11-11 DIAGNOSIS — Z720 Tobacco use: Secondary | ICD-10-CM | POA: Diagnosis not present

## 2018-11-11 DIAGNOSIS — E785 Hyperlipidemia, unspecified: Secondary | ICD-10-CM | POA: Diagnosis not present

## 2019-01-31 DIAGNOSIS — I10 Essential (primary) hypertension: Secondary | ICD-10-CM | POA: Diagnosis not present

## 2019-01-31 DIAGNOSIS — Z6829 Body mass index (BMI) 29.0-29.9, adult: Secondary | ICD-10-CM | POA: Diagnosis not present

## 2019-01-31 DIAGNOSIS — K210 Gastro-esophageal reflux disease with esophagitis: Secondary | ICD-10-CM | POA: Diagnosis not present

## 2019-01-31 DIAGNOSIS — N400 Enlarged prostate without lower urinary tract symptoms: Secondary | ICD-10-CM | POA: Diagnosis not present

## 2019-02-08 DIAGNOSIS — Z86711 Personal history of pulmonary embolism: Secondary | ICD-10-CM | POA: Diagnosis not present

## 2019-02-08 DIAGNOSIS — E785 Hyperlipidemia, unspecified: Secondary | ICD-10-CM | POA: Diagnosis not present

## 2019-02-08 DIAGNOSIS — Z7982 Long term (current) use of aspirin: Secondary | ICD-10-CM | POA: Diagnosis not present

## 2019-02-08 DIAGNOSIS — K219 Gastro-esophageal reflux disease without esophagitis: Secondary | ICD-10-CM | POA: Diagnosis not present

## 2019-02-08 DIAGNOSIS — N189 Chronic kidney disease, unspecified: Secondary | ICD-10-CM | POA: Diagnosis not present

## 2019-02-08 DIAGNOSIS — R509 Fever, unspecified: Secondary | ICD-10-CM | POA: Diagnosis not present

## 2019-02-08 DIAGNOSIS — Z79899 Other long term (current) drug therapy: Secondary | ICD-10-CM | POA: Diagnosis not present

## 2019-02-08 DIAGNOSIS — R05 Cough: Secondary | ICD-10-CM | POA: Diagnosis not present

## 2019-02-08 DIAGNOSIS — I129 Hypertensive chronic kidney disease with stage 1 through stage 4 chronic kidney disease, or unspecified chronic kidney disease: Secondary | ICD-10-CM | POA: Diagnosis not present

## 2019-02-08 DIAGNOSIS — Z8673 Personal history of transient ischemic attack (TIA), and cerebral infarction without residual deficits: Secondary | ICD-10-CM | POA: Diagnosis not present

## 2019-02-09 DIAGNOSIS — B349 Viral infection, unspecified: Secondary | ICD-10-CM | POA: Diagnosis not present

## 2019-02-09 DIAGNOSIS — R05 Cough: Secondary | ICD-10-CM | POA: Diagnosis not present

## 2019-02-09 DIAGNOSIS — Z2089 Contact with and (suspected) exposure to other communicable diseases: Secondary | ICD-10-CM | POA: Diagnosis not present

## 2019-02-09 DIAGNOSIS — R0602 Shortness of breath: Secondary | ICD-10-CM | POA: Diagnosis not present

## 2019-02-09 DIAGNOSIS — M940 Chondrocostal junction syndrome [Tietze]: Secondary | ICD-10-CM | POA: Diagnosis not present

## 2019-02-09 DIAGNOSIS — J069 Acute upper respiratory infection, unspecified: Secondary | ICD-10-CM | POA: Diagnosis not present

## 2019-02-09 DIAGNOSIS — Z0389 Encounter for observation for other suspected diseases and conditions ruled out: Secondary | ICD-10-CM | POA: Diagnosis not present

## 2019-02-13 DIAGNOSIS — J40 Bronchitis, not specified as acute or chronic: Secondary | ICD-10-CM | POA: Diagnosis not present

## 2019-02-13 DIAGNOSIS — Z6828 Body mass index (BMI) 28.0-28.9, adult: Secondary | ICD-10-CM | POA: Diagnosis not present

## 2019-04-06 DIAGNOSIS — N400 Enlarged prostate without lower urinary tract symptoms: Secondary | ICD-10-CM | POA: Diagnosis not present

## 2019-04-06 DIAGNOSIS — Z6830 Body mass index (BMI) 30.0-30.9, adult: Secondary | ICD-10-CM | POA: Diagnosis not present

## 2019-04-06 DIAGNOSIS — N39498 Other specified urinary incontinence: Secondary | ICD-10-CM | POA: Diagnosis not present

## 2019-07-14 DIAGNOSIS — N39498 Other specified urinary incontinence: Secondary | ICD-10-CM | POA: Diagnosis not present

## 2019-07-14 DIAGNOSIS — Z0000 Encounter for general adult medical examination without abnormal findings: Secondary | ICD-10-CM | POA: Diagnosis not present

## 2019-07-14 DIAGNOSIS — Z1389 Encounter for screening for other disorder: Secondary | ICD-10-CM | POA: Diagnosis not present

## 2019-07-14 DIAGNOSIS — Z6830 Body mass index (BMI) 30.0-30.9, adult: Secondary | ICD-10-CM | POA: Diagnosis not present

## 2019-07-14 DIAGNOSIS — I10 Essential (primary) hypertension: Secondary | ICD-10-CM | POA: Diagnosis not present

## 2019-07-14 DIAGNOSIS — N189 Chronic kidney disease, unspecified: Secondary | ICD-10-CM | POA: Diagnosis not present

## 2019-07-14 DIAGNOSIS — N400 Enlarged prostate without lower urinary tract symptoms: Secondary | ICD-10-CM | POA: Diagnosis not present

## 2019-07-14 DIAGNOSIS — K210 Gastro-esophageal reflux disease with esophagitis: Secondary | ICD-10-CM | POA: Diagnosis not present

## 2019-07-21 DIAGNOSIS — I10 Essential (primary) hypertension: Secondary | ICD-10-CM | POA: Diagnosis not present

## 2019-07-21 DIAGNOSIS — S0181XA Laceration without foreign body of other part of head, initial encounter: Secondary | ICD-10-CM | POA: Diagnosis not present

## 2019-07-21 DIAGNOSIS — E785 Hyperlipidemia, unspecified: Secondary | ICD-10-CM | POA: Diagnosis not present

## 2019-10-11 ENCOUNTER — Ambulatory Visit (INDEPENDENT_AMBULATORY_CARE_PROVIDER_SITE_OTHER): Payer: Medicare PPO

## 2019-10-11 ENCOUNTER — Ambulatory Visit: Payer: Medicare PPO | Admitting: Physical Medicine and Rehabilitation

## 2019-10-11 ENCOUNTER — Encounter: Payer: Self-pay | Admitting: Physical Medicine and Rehabilitation

## 2019-10-11 ENCOUNTER — Other Ambulatory Visit: Payer: Self-pay

## 2019-10-11 VITALS — BP 187/93 | HR 56

## 2019-10-11 DIAGNOSIS — M5441 Lumbago with sciatica, right side: Secondary | ICD-10-CM

## 2019-10-11 DIAGNOSIS — G8929 Other chronic pain: Secondary | ICD-10-CM | POA: Diagnosis not present

## 2019-10-11 DIAGNOSIS — M51369 Other intervertebral disc degeneration, lumbar region without mention of lumbar back pain or lower extremity pain: Secondary | ICD-10-CM

## 2019-10-11 DIAGNOSIS — M545 Low back pain, unspecified: Secondary | ICD-10-CM

## 2019-10-11 DIAGNOSIS — M5136 Other intervertebral disc degeneration, lumbar region: Secondary | ICD-10-CM | POA: Diagnosis not present

## 2019-10-11 MED ORDER — GABAPENTIN 300 MG PO CAPS: 300.0000 mg | ORAL_CAPSULE | Freq: Every day | ORAL | 0 refills | Status: DC

## 2019-10-11 MED ORDER — PREDNISONE 50 MG PO TABS: ORAL_TABLET | ORAL | 0 refills | Status: DC

## 2019-10-11 NOTE — Progress Notes (Signed)
Joshua Farmer - 78 y.o. male MRN [id number]  Date of birth: 1940-11-25  Office Visit Note: Visit Date: 10/11/2019 PCP: Neale Burly, MD Referred by: Neale Burly, MD  Subjective: Chief Complaint  Patient presents with   Lower Back - Pain   HPI: Joshua Farmer is a 78 y.o. male who comes in today For new patient evaluation and management of several weeks of right-sided low back pain with referral in the right hip and leg.  He is a self-referral I believe.  He describes about 4 to 5 weeks of sudden onset but progressive worsening right low back pain that started to refer into the lateral hip down to about the knee.  He denies any symptoms past the knee.  He denies any left-sided symptoms.  There was no specific trauma or injury or inciting event.  He has had no bowel or bladder changes no focal weakness no numbness or paresthesia.  He has had no history of lumbar surgery.  He reports no associated unintended weight loss or night pain.  He has not really had any imaging studies done or treatment.  He was initially taking ibuprofen with mild relief but this was per his report causing kidney problems and his primary care physician told him to switch to Tylenol.  He has been taking 4 to 6 tablets of extra strength Tylenol per day with only mild relief.  He reports worsening with prolonged sitting better with walking.  His average pain is 8 out of 10 and it does affect his daily living.  He reports that many years ago he had some type of work type injury and at that time was having some back pain and saw Dr. Glenna Fellows in [place].  I did find a lumbar CT discogram from 2011 which was really inconsistent with the patient's pain pattern at the time.  He says he went on just to have the pain go away on its own after a period of time.  He had no injections or other treatment.  He has had physical therapy at the time for that but not recently.  Review of Systems  Constitutional: Negative for chills, fever,  malaise/fatigue and weight loss.  HENT: Negative for hearing loss and sinus pain.   Eyes: Negative for blurred vision, double vision and photophobia.  Respiratory: Negative for cough and shortness of breath.   Cardiovascular: Negative for chest pain, palpitations and leg swelling.  Gastrointestinal: Negative for abdominal pain, nausea and vomiting.  Genitourinary: Negative for flank pain.  Musculoskeletal: Positive for back pain. Negative for myalgias.       Right thigh pain  Skin: Negative for itching and rash.  Neurological: Negative for tingling, tremors, focal weakness and weakness.  Endo/Heme/Allergies: Negative.   Psychiatric/Behavioral: Negative for depression.  All other systems reviewed and are negative.  Otherwise per HPI.  Assessment & Plan: Visit Diagnoses:  1. Acute right-sided low back pain with right-sided sciatica   2. Chronic bilateral low back pain without sciatica   3. Other intervertebral disc degeneration, lumbar region     Plan: Findings:  4 to 5 weeks of progressive worsening right low back and hip and thigh pain to the knee which I feel like based on his x-ray and clinical pattern is probably related to facet arthropathy at L5-S1 with foraminal narrowing particularly on the right L5 and perhaps a small disc protrusion that obviously we can see on x-ray.  No other worrisome findings on x-ray or exam.  I think it 5 weeks he has a chance for this to resolve on its own.  Also the differential diagnostic list would be facet joint pain by itself and possibly iliotibial band syndrome but I do still think he has been doing anything to aggravate that.  He did has some minor pain over the trochanter on palpation.  At this point we are going to complete a course of 50 mg of prednisone for 5 days.  He takes 5 mg of prednisone at baseline for a skin allergy of some sort.  He will continue to take the 5 mg after the 50 mg course.  I also want him to take 2 tablets of the extra  strength Tylenol 3 times per day scheduled.  We are also going to provide gabapentin at night as he was having some difficulty sleeping at night.  Depending on how he does over the next few weeks we would look at either diagnostic epidural injection versus MRI of the lumbar spine.  Patient could do well with regrouping with physical therapy depending on course and outcome.  I also gave him exercises to do including neural flossing and demonstrated that to him.    Meds & Orders:  Meds ordered this encounter  Medications   predniSONE (DELTASONE) 50 MG tablet    Sig: Take 1 tablet daily with food for 5 days until finished    Dispense:  5 tablet    Refill:  0   gabapentin (NEURONTIN) 300 MG capsule    Sig: Take 1 capsule (300 mg total) by mouth at bedtime.    Dispense:  90 capsule    Refill:  0    Orders Placed This Encounter  Procedures   XR Lumbar Spine Complete    Follow-up: Return in about 4 weeks (around 11/08/2019).   Procedures: No procedures performed  No notes on file   Clinical History: CT lumbar discogram 2011 - Glenna Fellows, MD IMPRESSION:   Somewhat discordant lumbar discography and post discogram CT.  More impressive changes with regard to annular tears and opacified disc material can be seen at L4-5 on the right where right L4 and right L5 nerve root encroachment observed, but this level was asymptomatic.   The symptomatic level however was L3-4 with partial reproduction of the patient's right-sided symptomatology. The annular tears with regard to right-sided symptomatology are unimpressive on CT.  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  Provider: Cleda Farmer   He reports that he has never smoked. He has never used smokeless tobacco. No results for input(s): HGBA1C, LABURIC in the last 8760 hours.  Objective:  VS:  HT:     WT:    BMI:      BP:(!) 187/93   HR:(!) 56bpm   TEMP: ( )   RESP:  Physical Exam Vitals signs and nursing note reviewed.  Constitutional:        General: He is not in acute distress.    Appearance: He is well-developed. He is obese.  HENT:     Head: Normocephalic and atraumatic.     Nose: Nose normal.     Mouth/Throat:     Mouth: Mucous membranes are moist.     Pharynx: Oropharynx is clear.  Eyes:     Conjunctiva/sclera: Conjunctivae normal.     Pupils: Pupils are equal, round, and reactive to light.  Neck:     Musculoskeletal: Normal range of motion and neck supple.     Trachea: No tracheal deviation.  Cardiovascular:  Rate and Rhythm: Normal rate and regular rhythm.     Pulses: Normal pulses.  Pulmonary:     Effort: Pulmonary effort is normal.     Breath sounds: Normal breath sounds.  Abdominal:     General: There is no distension.     Palpations: Abdomen is soft.     Tenderness: There is no guarding or rebound.  Musculoskeletal:        General: No deformity.     Right lower leg: No edema.     Left lower leg: No edema.     Comments: Patient ambulates without aid somewhat forward flexed lumbar spine.  He does have pain with extension and facet loading of the lower back.  He has no focal trigger point noted although there was some pain to palpation in the quadratus lumborum.  He does have mild pain over the greater trochanter but it does not reproduce all of his pain.  He has no pain with hip rotation internal or external.  He has good distal strength without clonus.  He has an equivocally positive slump test on the right leg with the hamstring pain.  Skin:    General: Skin is warm and dry.     Findings: No erythema or rash.  Neurological:     General: No focal deficit present.     Mental Status: He is alert and oriented to person, place, and time.     Sensory: No sensory deficit.     Motor: No weakness or abnormal muscle tone.     Coordination: Coordination normal.     Gait: Gait normal.  Psychiatric:        Mood and Affect: Mood normal.        Behavior: Behavior normal.        Thought Content: Thought  content normal.     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  There is no listhesis noted.  Hips appear to be well seated with mild/moderate degenerative change on the left compared to right but really good spacing of the joints.  There is some pelvic rotation but well seated sacroiliac joints without sclerosis.  No fractures or dislocations.   Past Medical/Family/Surgical/Social History: Medications & Allergies reviewed per EMR, new medications updated. Patient Active Problem List   Diagnosis Date Noted   Hypertension    High cholesterol    Gastroenteritis    Past Medical History:  Diagnosis Date   Gastroenteritis    High cholesterol    Hypertension    History reviewed. No pertinent family history. Past Surgical History:  Procedure Laterality Date   APPENDECTOMY     Social History   Occupational History   Not on file  Tobacco Use   Smoking status: Never Smoker   Smokeless tobacco: Never Used  Substance and Sexual Activity   Alcohol use: No    Frequency: Never   Drug use: No   Sexual activity: Not on file

## 2019-10-11 NOTE — Progress Notes (Signed)
  Numeric Pain Rating Scale and Functional Assessment Average Pain 8 Pain Right Now 8 My pain is constant, sharp and dull Pain is worse with: sitting Pain improves with: walking   In the last MONTH (on 0-10 scale) has pain interfered with the following?  1. General activity like being  able to carry out your everyday physical activities such as walking, climbing stairs, carrying groceries, or moving a chair?  Rating(5)  2. Relation with others like being able to carry out your usual social activities and roles such as  activities at home, at work and in your community. Rating(5)  3. Enjoyment of life such that you have  been bothered by emotional problems such as feeling anxious, depressed or irritable?  Rating(7)

## 2019-11-01 ENCOUNTER — Encounter: Payer: Self-pay | Admitting: Physical Medicine and Rehabilitation

## 2019-11-01 ENCOUNTER — Ambulatory Visit (INDEPENDENT_AMBULATORY_CARE_PROVIDER_SITE_OTHER): Payer: Medicare PPO | Admitting: Physical Medicine and Rehabilitation

## 2019-11-01 ENCOUNTER — Telehealth: Payer: Self-pay | Admitting: Physical Medicine and Rehabilitation

## 2019-11-01 ENCOUNTER — Other Ambulatory Visit: Payer: Self-pay

## 2019-11-01 VITALS — BP 190/95 | HR 73 | Ht 72.0 in | Wt 212.0 lb

## 2019-11-01 DIAGNOSIS — M5416 Radiculopathy, lumbar region: Secondary | ICD-10-CM | POA: Diagnosis not present

## 2019-11-01 DIAGNOSIS — G8929 Other chronic pain: Secondary | ICD-10-CM | POA: Diagnosis not present

## 2019-11-01 DIAGNOSIS — M5441 Lumbago with sciatica, right side: Secondary | ICD-10-CM

## 2019-11-01 DIAGNOSIS — M48061 Spinal stenosis, lumbar region without neurogenic claudication: Secondary | ICD-10-CM

## 2019-11-01 DIAGNOSIS — M47816 Spondylosis without myelopathy or radiculopathy, lumbar region: Secondary | ICD-10-CM | POA: Diagnosis not present

## 2019-11-01 NOTE — Progress Notes (Signed)
Joshua Farmer - 78 y.o. male MRN [id number]  Date of birth: 1940/12/27  Office Visit Note: Visit Date: 11/01/2019 PCP: Neale Burly, MD Referred by: Neale Burly, MD  Subjective: Chief Complaint  Patient presents with  . Lower Back - Pain  . Right Hip - Pain   HPI: Joshua Farmer is a 78 y.o. male who comes in today For evaluation management of chronic worsening severe low back pain with the right sided hip and thigh pain.  We saw the patient as a new patient evaluation about 4 weeks ago with similar complaints.  He has had ongoing recalcitrant symptoms now for about 9 weeks.  No specific injury.  No prior history of lumbar surgery etc.  Prior notes can be reviewed.  He reports taking 5 days of prednisone without any relief.  He continues to take the gabapentin we gave him at night.  He has not noticed any changes with that but is tolerating it.  He did start taking 2 extra Tylenol 3 times a day and that seems to have made some difference but not much.  He is not reporting any paresthesias or focal weakness or any change in symptoms that are severe.  Pain is severe and limiting.  Pain is worse with prolonged sitting but also movement he does get some relief with walking.  Pain is sharp and dull at times and rates it as an 8 out of 8.  Has been doing some home exercises including neural flossing without any relief.  Review of Systems  Constitutional: Negative for chills, fever, malaise/fatigue and weight loss.  HENT: Negative for hearing loss and sinus pain.   Eyes: Negative for blurred vision, double vision and photophobia.  Respiratory: Negative for cough and shortness of breath.   Cardiovascular: Negative for chest pain, palpitations and leg swelling.  Gastrointestinal: Negative for abdominal pain, nausea and vomiting.  Genitourinary: Negative for flank pain.  Musculoskeletal: Positive for back pain. Negative for myalgias.  Skin: Negative for itching and rash.  Neurological: Negative  for tremors, focal weakness and weakness.  Endo/Heme/Allergies: Negative.   Psychiatric/Behavioral: Negative for depression.  All other systems reviewed and are negative.  Otherwise per HPI.  Assessment & Plan: Visit Diagnoses:  1. 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 Foraminal stenosis of lumbar region   4. Chronic right-sided low back pain with right-sided sciatica     Plan: Findings:  Chronic worsening severe at times low back pain with right hip and thigh pain without paresthesia.  X-ray showed foraminal narrowing and facet arthritis.  I do think he is getting an L5 radicular complaint which is probably a combination of small disc protrusion or maybe lateral protrusion with arthritic change at the lower levels.  I think at this point given the longevity of the symptoms it is time to complete epidural injection diagnostically and hopefully therapeutically.  If he gets a lot of relief but short lasting would look at MRI of the lumbar spine.  If he does not get any relief definitely get MRI of the lumbar spine.  He will continue with home exercises and he is going to increase the gabapentin to 1 in the morning and 1 at night.  Depending on results would be sure to get MRI of the lumbar spine.  Would make further recommendations at that point.  Injection would be L5-S1 right interlaminar injection.    Meds & Orders: No orders of  the defined types were placed in this encounter.  No orders of the defined types were placed in this encounter.   Follow-up: Return for Right L5-S1 Epidural steroid injection.   Procedures: No procedures performed  No notes on file   Clinical History: CT lumbar discogram 2011 - Joshua Fellows, MD IMPRESSION:   Somewhat discordant lumbar discography and post discogram CT.  More impressive changes with regard to annular tears and opacified disc material can be seen at L4-5 on the right where right L4 and  right L5 nerve root encroachment observed, but this level was asymptomatic.   The symptomatic level however was L3-4 with partial reproduction of the patient's right-sided symptomatology. The annular tears with regard to right-sided symptomatology are unimpressive on CT.  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  Provider: Cleda Farmer   He reports that he has never smoked. He has never used smokeless tobacco. No results for input(s): HGBA1C, LABURIC in the last 8760 hours.  Objective:  VS:  HT:6' (182.9 cm)   WT:212 lb (96.2 kg)  BMI:28.75    BP:(!) 190/95(checked manually, 3rd attempt)  HR:73bpm  TEMP: ( )  RESP:  Physical Exam Vitals and nursing note reviewed.  Constitutional:      General: He is not in acute distress.    Appearance: Normal appearance. He is well-developed.  HENT:     Head: Normocephalic and atraumatic.  Eyes:     Conjunctiva/sclera: Conjunctivae normal.     Pupils: Pupils are equal, round, and reactive to light.  Cardiovascular:     Rate and Rhythm: Normal rate.     Pulses: Normal pulses.     Heart sounds: Normal heart sounds.  Pulmonary:     Effort: Pulmonary effort is normal. No respiratory distress.  Musculoskeletal:     Cervical back: Normal range of motion and neck supple. No rigidity.     Right lower leg: No edema.     Left lower leg: No edema.     Comments: Patient ambulates without aid.  He is slow to rise from seated position full extension.  He does have a positive slump test on the right.  No pain over the greater trochanters and no pain with hip rotation.  Skin:    General: Skin is warm and dry.     Findings: No erythema or rash.  Neurological:     General: No focal deficit present.     Mental Status: He is alert and oriented to person, place, and time.     Sensory: No sensory deficit.     Coordination: Coordination normal.     Gait: Gait normal.  Psychiatric:        Mood and Affect: Mood normal.        Behavior: Behavior normal.      Ortho Exam Imaging: No results found.  Past Medical/Family/Surgical/Social History: Medications & Allergies reviewed per EMR, new medications updated. Patient Active Problem List   Diagnosis Date Noted  . Hypertension   . High cholesterol   . Gastroenteritis    Past Medical History:  Diagnosis Date  . Gastroenteritis   . High cholesterol   . Hypertension    History reviewed. No pertinent family history. Past Surgical History:  Procedure Laterality Date  . APPENDECTOMY     Social History   Occupational History  . Not on file  Tobacco Use  . Smoking status: Never Smoker  . Smokeless tobacco: Never Used  Substance and Sexual Activity  . Alcohol use: No  .  Drug use: No  . Sexual activity: Not on file

## 2019-11-01 NOTE — Telephone Encounter (Signed)
Your authorization request is approved. Your interim reference number for this approval is: [id number]. The actual Request ID reference number will be presented usually within 30 minutes.  Auth No / Request ID  Status Auto-Approved Decision Approved Effective Date 11/01/2019 Expiration Date 12/16/2019 Decision Date 11/01/2019

## 2019-11-01 NOTE — Progress Notes (Signed)
 .  Numeric Pain Rating Scale and Functional Assessment Average Pain 8 Pain Right Now 8 My pain is constant, sharp and dull Pain is worse with: sitting Pain improves with: walking   In the last MONTH (on 0-10 scale) has pain interfered with the following?  1. General activity like being  able to carry out your everyday physical activities such as walking, climbing stairs, carrying groceries, or moving a chair?  Rating(7)  2. Relation with others like being able to carry out your usual social activities and roles such as  activities at home, at work and in your community. Rating(7)  3. Enjoyment of life such that you have  been bothered by emotional problems such as feeling anxious, depressed or irritable?  Rating(4)

## 2019-11-06 ENCOUNTER — Encounter: Payer: Self-pay | Admitting: Physical Medicine and Rehabilitation

## 2019-11-06 ENCOUNTER — Ambulatory Visit (INDEPENDENT_AMBULATORY_CARE_PROVIDER_SITE_OTHER): Payer: Medicare PPO | Admitting: Physical Medicine and Rehabilitation

## 2019-11-06 ENCOUNTER — Other Ambulatory Visit: Payer: Self-pay

## 2019-11-06 ENCOUNTER — Ambulatory Visit: Payer: Self-pay

## 2019-11-06 VITALS — BP 184/87 | HR 55

## 2019-11-06 DIAGNOSIS — M5416 Radiculopathy, lumbar region: Secondary | ICD-10-CM | POA: Diagnosis not present

## 2019-11-06 MED ORDER — METHYLPREDNISOLONE ACETATE 80 MG/ML IJ SUSP
40.0000 mg | Freq: Once | INTRAMUSCULAR | Status: AC
  Administered 2019-11-06: 15:00:00 40 mg

## 2019-11-06 NOTE — Progress Notes (Signed)
.  Numeric Pain Rating Scale and Functional Assessment Average Pain 7   In the last MONTH (on 0-10 scale) has pain interfered with the following?  1. General activity like being  able to carry out your everyday physical activities such as walking, climbing stairs, carrying groceries, or moving a chair?  Rating(7)   +Driver, -BT, -Dye Allergies.   

## 2019-11-07 NOTE — Procedures (Signed)
Lumbar Epidural Steroid Injection - Interlaminar Approach with Fluoroscopic Guidance  Patient: Joshua Farmer      Date of Birth: 06-22-1941 MRN: [id number] PCP: Neale Burly, MD      Visit Date: 11/06/2019   Universal Protocol:     Consent Given By: the patient  Position: PRONE  Additional Comments: Vital signs were monitored before and after the procedure. Patient was prepped and draped in the usual sterile fashion. The correct patient, procedure, and site was verified.   Injection Procedure Details:  Procedure Site One Meds Administered:  Meds ordered this encounter  Medications  . methylPREDNISolone acetate (DEPO-MEDROL) injection 40 mg     Laterality: Right  Location/Site:  L5-S1  Needle size: 20 G  Needle type: Tuohy  Needle Placement: Paramedian epidural  Findings:   -Comments: Excellent flow of contrast into the epidural space.  Procedure Details: Using a paramedian approach from the side mentioned above, the region overlying the inferior lamina was localized under fluoroscopic visualization and the soft tissues overlying this structure were infiltrated with 4 ml. of 1% Lidocaine without Epinephrine. The Tuohy needle was inserted into the epidural space using a paramedian approach.   The epidural space was localized using loss of resistance along with lateral and bi-planar fluoroscopic views.  After negative aspirate for air, blood, and CSF, a 2 ml. volume of Isovue-250 was injected into the epidural space and the flow of contrast was observed. Radiographs were obtained for documentation purposes.    The injectate was administered into the level noted above.   Additional Comments:  The patient tolerated the procedure well Dressing: 2 x 2 sterile gauze and Band-Aid    Post-procedure details: Patient was observed during the procedure. Post-procedure instructions were reviewed.  Patient left the clinic in stable condition.

## 2019-11-07 NOTE — Progress Notes (Signed)
Joshua Farmer - 79 y.o. male MRN [id number]  Date of birth: 15-Dec-1940  Office Visit Note: Visit Date: 11/06/2019 PCP: Neale Burly, MD Referred by: Neale Burly, MD  Subjective: Chief Complaint  Patient presents with  . Lower Back - Pain  . Right Thigh - Pain   HPI:  Joshua Farmer is a 79 y.o. male who comes in today For planned right L5-S1 interlaminar epidural steroid injection. See prior notes for further detains and justification.  ROS Otherwise per HPI.  Assessment & Plan: Visit Diagnoses:  1. Lumbar radiculopathy     Plan: No additional findings.   Meds & Orders:  Meds ordered this encounter  Medications  . methylPREDNISolone acetate (DEPO-MEDROL) injection 40 mg    Orders Placed This Encounter  Procedures  . XR C-ARM NO REPORT  . Epidural Steroid injection    Follow-up: Return if symptoms worsen or fail to improve.   Procedures: No procedures performed  Lumbar Epidural Steroid Injection - Interlaminar Approach with Fluoroscopic Guidance  Patient: Joshua Farmer      Date of Birth: [birth year] MRN: [id number] PCP: Neale Burly, MD      Visit Date: 11/06/2019   Universal Protocol:     Consent Given By: the patient  Position: PRONE  Additional Comments: Vital signs were monitored before and after the procedure. Patient was prepped and draped in the usual sterile fashion. The correct patient, procedure, and site was verified.   Injection Procedure Details:  Procedure Site One Meds Administered:  Meds ordered this encounter  Medications  . methylPREDNISolone acetate (DEPO-MEDROL) injection 40 mg     Laterality: Right  Location/Site:  L5-S1  Needle size: 20 G  Needle type: Tuohy  Needle Placement: Paramedian epidural  Findings:   -Comments: Excellent flow of contrast into the epidural space.  Procedure Details: Using a paramedian approach from the side mentioned above, the region overlying the inferior lamina was localized under  fluoroscopic visualization and the soft tissues overlying this structure were infiltrated with 4 ml. of 1% Lidocaine without Epinephrine. The Tuohy needle was inserted into the epidural space using a paramedian approach.   The epidural space was localized using loss of resistance along with lateral and bi-planar fluoroscopic views.  After negative aspirate for air, blood, and CSF, a 2 ml. volume of Isovue-250 was injected into the epidural space and the flow of contrast was observed. Radiographs were obtained for documentation purposes.    The injectate was administered into the level noted above.   Additional Comments:  The patient tolerated the procedure well Dressing: 2 x 2 sterile gauze and Band-Aid    Post-procedure details: Patient was observed during the procedure. Post-procedure instructions were reviewed.  Patient left the clinic in stable condition.    Clinical History: CT lumbar discogram 2011 - Glenna Fellows, MD IMPRESSION:   Somewhat discordant lumbar discography and post discogram CT.  More impressive changes with regard to annular tears and opacified disc material can be seen at L4-5 on the right where right L4 and right L5 nerve root encroachment observed, but this level was asymptomatic.   The symptomatic level however was L3-4 with partial reproduction of the patient's right-sided symptomatology. The annular tears with regard to right-sided symptomatology are unimpressive on CT.  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  Provider: Cleda Mccreedy     Objective:  VS:  HT:    WT:   BMI:     BP:(!) 184/87  HR:(!) 55bpm  TEMP: ( )  RESP:  Physical Exam  Ortho Exam Imaging: XR C-ARM NO REPORT  Result Date: 11/06/2019 Please see Notes tab for imaging impression.

## 2019-11-15 DIAGNOSIS — N400 Enlarged prostate without lower urinary tract symptoms: Secondary | ICD-10-CM | POA: Diagnosis not present

## 2019-11-15 DIAGNOSIS — Z0000 Encounter for general adult medical examination without abnormal findings: Secondary | ICD-10-CM | POA: Diagnosis not present

## 2019-11-15 DIAGNOSIS — Z6831 Body mass index (BMI) 31.0-31.9, adult: Secondary | ICD-10-CM | POA: Diagnosis not present

## 2019-11-15 DIAGNOSIS — N39498 Other specified urinary incontinence: Secondary | ICD-10-CM | POA: Diagnosis not present

## 2019-11-15 DIAGNOSIS — K2100 Gastro-esophageal reflux disease with esophagitis, without bleeding: Secondary | ICD-10-CM | POA: Diagnosis not present

## 2019-11-15 DIAGNOSIS — I10 Essential (primary) hypertension: Secondary | ICD-10-CM | POA: Diagnosis not present

## 2019-12-13 ENCOUNTER — Telehealth: Payer: Self-pay | Admitting: Physical Medicine and Rehabilitation

## 2019-12-13 NOTE — Telephone Encounter (Signed)
He needs lumbar spine MRI.

## 2019-12-13 NOTE — Telephone Encounter (Signed)
Home number no longer in service. Busy signal on mobile number. Patient did not leave phone number on message.

## 2019-12-29 DIAGNOSIS — R509 Fever, unspecified: Secondary | ICD-10-CM | POA: Diagnosis not present

## 2019-12-29 DIAGNOSIS — M791 Myalgia, unspecified site: Secondary | ICD-10-CM | POA: Diagnosis not present

## 2019-12-29 DIAGNOSIS — R5383 Other fatigue: Secondary | ICD-10-CM | POA: Diagnosis not present

## 2019-12-29 DIAGNOSIS — T50Z95A Adverse effect of other vaccines and biological substances, initial encounter: Secondary | ICD-10-CM | POA: Diagnosis not present

## 2019-12-29 DIAGNOSIS — R519 Headache, unspecified: Secondary | ICD-10-CM | POA: Diagnosis not present

## 2020-01-04 ENCOUNTER — Other Ambulatory Visit: Payer: Self-pay | Admitting: Physical Medicine and Rehabilitation

## 2020-01-04 DIAGNOSIS — M5416 Radiculopathy, lumbar region: Secondary | ICD-10-CM

## 2020-01-04 DIAGNOSIS — M48061 Spinal stenosis, lumbar region without neurogenic claudication: Secondary | ICD-10-CM

## 2020-02-01 ENCOUNTER — Other Ambulatory Visit: Payer: Self-pay

## 2020-02-01 ENCOUNTER — Ambulatory Visit (HOSPITAL_COMMUNITY)
Admission: RE | Admit: 2020-02-01 | Discharge: 2020-02-01 | Disposition: A | Discharge status: Home / Self Care | Payer: Medicare HMO | Source: Ambulatory Visit | Attending: Physical Medicine and Rehabilitation | Admitting: Physical Medicine and Rehabilitation

## 2020-02-01 ENCOUNTER — Telehealth: Payer: Self-pay | Admitting: Physical Medicine and Rehabilitation

## 2020-02-01 DIAGNOSIS — M48061 Spinal stenosis, lumbar region without neurogenic claudication: Secondary | ICD-10-CM | POA: Diagnosis not present

## 2020-02-01 DIAGNOSIS — M5416 Radiculopathy, lumbar region: Secondary | ICD-10-CM | POA: Diagnosis not present

## 2020-02-01 DIAGNOSIS — M545 Low back pain: Secondary | ICD-10-CM | POA: Diagnosis not present

## 2020-02-01 NOTE — Telephone Encounter (Signed)
-----   Message from Magnus Sinning, MD sent at 02/01/2020  9:30 AM EDT ----- Regarding: post MRI 1.) not sure if we have him coming in for review but need right L5 tf esi  2) He needs to take copy or have PCP review MRI for radiologist recommendation for urology follow up for left hydronephrosis.

## 2020-02-01 NOTE — Telephone Encounter (Signed)
Needs auth for [phone]. Scheduled for 4/27.

## 2020-02-01 NOTE — Telephone Encounter (Signed)
Left message #1

## 2020-02-05 DIAGNOSIS — N39498 Other specified urinary incontinence: Secondary | ICD-10-CM | POA: Diagnosis not present

## 2020-02-05 DIAGNOSIS — I10 Essential (primary) hypertension: Secondary | ICD-10-CM | POA: Diagnosis not present

## 2020-02-05 DIAGNOSIS — Z6831 Body mass index (BMI) 31.0-31.9, adult: Secondary | ICD-10-CM | POA: Diagnosis not present

## 2020-02-05 DIAGNOSIS — K2100 Gastro-esophageal reflux disease with esophagitis, without bleeding: Secondary | ICD-10-CM | POA: Diagnosis not present

## 2020-02-05 DIAGNOSIS — N401 Enlarged prostate with lower urinary tract symptoms: Secondary | ICD-10-CM | POA: Diagnosis not present

## 2020-02-05 NOTE — Telephone Encounter (Signed)
Approved Authorization [id number] (Tracking [id number]) Effective 02/05/20-03/06/20

## 2020-02-08 DIAGNOSIS — R3915 Urgency of urination: Secondary | ICD-10-CM | POA: Diagnosis not present

## 2020-02-08 DIAGNOSIS — N1330 Unspecified hydronephrosis: Secondary | ICD-10-CM | POA: Diagnosis not present

## 2020-02-08 DIAGNOSIS — N281 Cyst of kidney, acquired: Secondary | ICD-10-CM | POA: Diagnosis not present

## 2020-02-08 DIAGNOSIS — N130 Hydronephrosis with ureteropelvic junction obstruction: Secondary | ICD-10-CM | POA: Diagnosis not present

## 2020-02-08 DIAGNOSIS — N200 Calculus of kidney: Secondary | ICD-10-CM | POA: Diagnosis not present

## 2020-02-08 DIAGNOSIS — R351 Nocturia: Secondary | ICD-10-CM | POA: Diagnosis not present

## 2020-02-13 ENCOUNTER — Other Ambulatory Visit: Payer: Self-pay | Admitting: Nurse Practitioner

## 2020-02-13 DIAGNOSIS — Q6239 Other obstructive defects of renal pelvis and ureter: Secondary | ICD-10-CM

## 2020-02-26 ENCOUNTER — Other Ambulatory Visit: Payer: Self-pay

## 2020-02-26 ENCOUNTER — Encounter (HOSPITAL_COMMUNITY)
Admission: RE | Admit: 2020-02-26 | Discharge: 2020-02-26 | Disposition: A | Discharge status: Home / Self Care | Payer: Medicare HMO | Source: Ambulatory Visit | Attending: Nurse Practitioner | Admitting: Nurse Practitioner

## 2020-02-26 DIAGNOSIS — Q6239 Other obstructive defects of renal pelvis and ureter: Secondary | ICD-10-CM | POA: Diagnosis not present

## 2020-02-26 DIAGNOSIS — R109 Unspecified abdominal pain: Secondary | ICD-10-CM | POA: Diagnosis not present

## 2020-02-26 MED ORDER — FUROSEMIDE 10 MG/ML IJ SOLN
48.0000 mg | Freq: Once | INTRAMUSCULAR | Status: AC
  Administered 2020-02-26: 11:00:00 48 mg via INTRAVENOUS

## 2020-02-26 MED ORDER — TECHNETIUM TO 99M ALBUMIN AGGREGATED
5.4000 | Freq: Once | INTRAVENOUS | Status: AC
  Administered 2020-02-26: 11:00:00 5.4 via INTRAVENOUS

## 2020-02-26 MED ORDER — FUROSEMIDE 10 MG/ML IJ SOLN
INTRAMUSCULAR | Status: AC
  Filled 2020-02-26: qty 8

## 2020-02-27 ENCOUNTER — Ambulatory Visit (INDEPENDENT_AMBULATORY_CARE_PROVIDER_SITE_OTHER): Payer: Medicare HMO | Admitting: Physical Medicine and Rehabilitation

## 2020-02-27 ENCOUNTER — Ambulatory Visit: Payer: Self-pay

## 2020-02-27 ENCOUNTER — Encounter: Payer: Self-pay | Admitting: Physical Medicine and Rehabilitation

## 2020-02-27 VITALS — BP 136/83 | HR 83

## 2020-02-27 DIAGNOSIS — M5416 Radiculopathy, lumbar region: Secondary | ICD-10-CM | POA: Diagnosis not present

## 2020-02-27 MED ORDER — METHYLPREDNISOLONE ACETATE 80 MG/ML IJ SUSP
40.0000 mg | Freq: Once | INTRAMUSCULAR | Status: AC
  Administered 2020-02-27: 40 mg

## 2020-02-27 NOTE — Progress Notes (Signed)
 .  Numeric Pain Rating Scale and Functional Assessment Average Pain 8   In the last MONTH (on 0-10 scale) has pain interfered with the following?  1. General activity like being  able to carry out your everyday physical activities such as walking, climbing stairs, carrying groceries, or moving a chair?  Rating(8)   +Driver, -BT, -Dye Allergies.  

## 2020-02-28 NOTE — Procedures (Signed)
Lumbosacral Transforaminal Epidural Steroid Injection - Sub-Pedicular Approach with Fluoroscopic Guidance  Patient: Joshua Farmer      Date of Birth: [birth year] MRN: [id number] PCP: Neale Burly, MD      Visit Date: 02/27/2020   Universal Protocol:    Date/Time: 02/27/2020  Consent Given By: the patient  Position: PRONE  Additional Comments: Vital signs were monitored before and after the procedure. Patient was prepped and draped in the usual sterile fashion. The correct patient, procedure, and site was verified.   Injection Procedure Details:  Procedure Site One Meds Administered:  Meds ordered this encounter  Medications  . methylPREDNISolone acetate (DEPO-MEDROL) injection 40 mg    Laterality: Bilateral  Location/Site:  L4-L5  Needle size: 22 G  Needle type: Spinal  Needle Placement: Transforaminal  Findings:    -Comments: Excellent flow of contrast along the nerve and into the epidural space.  Procedure Details: After squaring off the end-plates to get a true AP view, the C-arm was positioned so that an oblique view of the foramen as noted above was visualized. The target area is just inferior to the "nose of the scotty dog" or sub pedicular. The soft tissues overlying this structure were infiltrated with 2-3 ml. of 1% Lidocaine without Epinephrine.  The spinal needle was inserted toward the target using a "trajectory" view along the fluoroscope beam.  Under AP and lateral visualization, the needle was advanced so it did not puncture dura and was located close the 6 O'Clock position of the pedical in AP tracterory. Biplanar projections were used to confirm position. Aspiration was confirmed to be negative for CSF and/or blood. A 1-2 ml. volume of Isovue-250 was injected and flow of contrast was noted at each level. Radiographs were obtained for documentation purposes.   After attaining the desired flow of contrast documented above, a 0.5 to 1.0 ml test dose of  0.25% Marcaine was injected into each respective transforaminal space.  The patient was observed for 90 seconds post injection.  After no sensory deficits were reported, and normal lower extremity motor function was noted,   the above injectate was administered so that equal amounts of the injectate were placed at each foramen (level) into the transforaminal epidural space.   Additional Comments:  The patient tolerated the procedure well Dressing: 2 x 2 sterile gauze and Band-Aid    Post-procedure details: Patient was observed during the procedure. Post-procedure instructions were reviewed.  Patient left the clinic in stable condition.

## 2020-02-28 NOTE — Progress Notes (Signed)
Joshua Farmer - 79 y.o. male MRN [id number]  Date of birth: 20-Mar-1941  Office Visit Note: Visit Date: 02/27/2020 PCP: Neale Burly, MD Referred by: Neale Burly, MD  Subjective: Chief Complaint  Patient presents with  . Lower Back - Pain   HPI:  Joshua Farmer is a 79 y.o. male who comes in today For MRI review of the lumbar spine as well as planned epidural injection.  Prior interlaminar injection gave him some mild relief.  He has had a history of chronic back pain for some time.  He was managed at 1 point by Dr. Carloyn Manner and we did have an old discogram to review from 2011.  He has had no prior lumbar surgery.  MRI reviewed with him today using spine models and imaging.  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  He has no high-grade central stenosis or listhesis.  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  He has facet arthropathy on the right at L5-S1.  Once again his pain complaints are 8 out of 10 pain worse with standing and movement.  Resting and Tylenol helps a little bit.  He has pain across the lower back into both buttocks.  He is not endorsing right-sided radicular pain as much today as he was when I first saw him.  He does get that the longer he stands.  I think the best approach today is bilateral L4 transforaminal injection and just see how he does.  He could be getting pain with facet joints of the L4-5 region I could refer her in the same area.  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  He is someone that could probably do well with laminectomy.  Unfortunately is also having issues with his kidney.  Hydronephrosis was seen on the MRI of the lumbar spine.  He has seen the urologist and has had nuclear medicine test looking at flow from each kidney.  He is set to review that with the urologist.  ROS Otherwise per HPI.  Assessment &  Plan: Visit Diagnoses:  1. Lumbar radiculopathy     Plan: No additional findings.   Meds & Orders:  Meds ordered this encounter  Medications  . methylPREDNISolone acetate (DEPO-MEDROL) injection 40 mg    Orders Placed This Encounter  Procedures  . XR C-ARM NO REPORT  . Epidural Steroid injection    Follow-up: Return if symptoms worsen or fail to improve, for Consider facet joint block versus different level of epidural.   Procedures: No procedures performed  Lumbosacral Transforaminal Epidural Steroid Injection - Sub-Pedicular Approach with Fluoroscopic Guidance  Patient: Joshua Farmer      Date of Birth: 13-Apr-1941 MRN: [id number] PCP: Neale Burly, MD      Visit Date: 02/27/2020   Universal Protocol:    Date/Time: 02/27/2020  Consent Given By: the patient  Position: PRONE  Additional Comments: Vital signs were monitored before and after the procedure. Patient was prepped and draped in the usual sterile fashion. The correct patient, procedure, and site was verified.   Injection Procedure Details:  Procedure Site One Meds Administered:  Meds ordered this encounter  Medications  . methylPREDNISolone acetate (DEPO-MEDROL) injection 40 mg    Laterality: Bilateral  Location/Site:  L4-L5  Needle size: 22 G  Needle type: Spinal  Needle Placement:  Transforaminal  Findings:    -Comments: Excellent flow of contrast along the nerve and into the epidural space.  Procedure Details: After squaring off the end-plates to get a true AP view, the C-arm was positioned so that an oblique view of the foramen as noted above was visualized. The target area is just inferior to the "nose of the scotty dog" or sub pedicular. The soft tissues overlying this structure were infiltrated with 2-3 ml. of 1% Lidocaine without Epinephrine.  The spinal needle was inserted toward the target using a "trajectory" view along the fluoroscope beam.  Under AP and lateral visualization, the  needle was advanced so it did not puncture dura and was located close the 6 O'Clock position of the pedical in AP tracterory. Biplanar projections were used to confirm position. Aspiration was confirmed to be negative for CSF and/or blood. A 1-2 ml. volume of Isovue-250 was injected and flow of contrast was noted at each level. Radiographs were obtained for documentation purposes.   After attaining the desired flow of contrast documented above, a 0.5 to 1.0 ml test dose of 0.25% Marcaine was injected into each respective transforaminal space.  The patient was observed for 90 seconds post injection.  After no sensory deficits were reported, and normal lower extremity motor function was noted,   the above injectate was administered so that equal amounts of the injectate were placed at each foramen (level) into the transforaminal epidural space.   Additional Comments:  The patient tolerated the procedure well Dressing: 2 x 2 sterile gauze and Band-Aid    Post-procedure details: Patient was observed during the procedure. Post-procedure instructions were reviewed.  Patient left the clinic in stable condition.     Clinical History: [facility] LUMBAR SPINE WITHOUT CONTRAST  TECHNIQUE: Multiplanar, multisequence MR imaging of the lumbar spine was performed. No intravenous contrast was administered.  COMPARISON:  Lumbar discogram CT 07/24/2010  FINDINGS: Segmentation:  Standard lumbar numbering  Alignment: Lower lumbar levoscoliosis and upper lumbar dextrocurvature.  Vertebrae:  No fracture, evidence of discitis, or bone lesion.  Conus medullaris and cauda equina: Conus extends to the L1 level. Conus and cauda equina appear normal.  Paraspinal and other soft tissues: Marked hydronephrosis with cortical thinning on the left. This was not noted on 2019 CT.  Disc levels:  T12- L1: Unremarkable.  L1-L2: Disc narrowing and foraminal predominant bulging. Negative facets and no  impingement  L2-L3: Disc narrowing and bulging with mild facet spurring. The canal and foramina remain patent  L3-L4: Disc narrowing and endplate degeneration with bulging that is asymmetric to the left, where there is also greater facet hypertrophy. Advanced left foraminal stenosis with L3 impingement. Mild spinal stenosis.  L4-L5: Disc narrowing and bulging with posterior element hypertrophy. Moderate biforaminal impingement. Mild spinal stenosis.  L5-S1:Disc narrowing with endplate degeneration and bulging asymmetric to the right. Asymmetric right degenerative facet spurring. Severe right foraminal stenosis L5 compression.  IMPRESSION: 1. 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 2. 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 3. L3-4 high-grade left foraminal impingement. 4. L4-5 moderate bilateral foraminal narrowing. 5. Left hydronephrosis and renal atrophy not described on 2019 abdominal CT, recommend urology follow-up.   Electronically Signed   By: Monte Fantasia M.D.   On: 02/01/2020 08:42     Objective:  VS:  HT:    WT:   BMI:     BP:136/83  HR:83bpm  TEMP: ( )  RESP:  Physical Exam  Ortho Exam Imaging:  XR C-ARM NO REPORT  Result Date: 02/27/2020 Please see Notes tab for imaging impression.

## 2020-03-06 DIAGNOSIS — N270 Small kidney, unilateral: Secondary | ICD-10-CM | POA: Diagnosis not present

## 2020-03-06 DIAGNOSIS — N130 Hydronephrosis with ureteropelvic junction obstruction: Secondary | ICD-10-CM | POA: Diagnosis not present

## 2020-03-08 ENCOUNTER — Other Ambulatory Visit: Payer: Self-pay | Admitting: Urology

## 2020-03-18 NOTE — Patient Instructions (Addendum)
DUE TO COVID-19 ONLY ONE VISITOR IS ALLOWED TO COME WITH YOU AND STAY IN THE WAITING ROOM ONLY DURING PRE OP AND PROCEDURE DAY OF SURGERY. THE 1 VISITOR MAY VISIT WITH YOU AFTER SURGERY IN YOUR PRIVATE ROOM DURING VISITING HOURS ONLY!  YOU NEED TO HAVE A COVID 19 TEST ON_5/20______ @_11 :20______, THIS TEST MUST BE DONE BEFORE SURGERY, COME  [address], [place] , [address].  ([place]) ONCE YOUR COVID TEST IS COMPLETED, PLEASE BEGIN THE QUARANTINE INSTRUCTIONS AS OUTLINED IN YOUR HANDOUT.                [name]    Your procedure is scheduled on: 03/25/20   Report to [facility] Main  Entrance   Report to admitting at  6:45 AM     Call this number if you have problems the morning of surgery [phone]    Remember: Do not eat food or drink liquids after Midnight.   BRUSH YOUR TEETH MORNING OF SURGERY AND RINSE YOUR MOUTH OUT, NO CHEWING GUM CANDY OR MINTS.     Take these medicines the morning of surgery with A SIP OF WATER: Amlodipine, Flomax. Nexium, Myrbetriq.                                 You may not have any metal on your body including              piercings  Do not wear jewelry,  lotions, powders or  deodorant                      Men may shave face and neck.   Do not bring valuables to the hospital. [place].  Contacts, dentures or bridgework may not be worn into surgery.       Patients discharged the day of surgery will not be allowed to drive home.   IF YOU ARE HAVING SURGERY AND GOING HOME THE SAME DAY, YOU MUST HAVE AN ADULT TO DRIVE YOU HOME AND BE WITH YOU FOR 24 HOURS.   YOU MAY GO HOME BY TAXI OR UBER OR ORTHERWISE, BUT AN ADULT MUST ACCOMPANY YOU HOME AND STAY WITH YOU FOR 24 HOURS.  Name and phone number of your driver:  Special Instructions: N/A              Please read over the following fact sheets you were  given: _____________________________________________________________________             [facility] - Preparing for Surgery Before surgery, you can play an important role.   Because skin is not sterile, your skin needs to be as free of germs as possible.   You can reduce the number of germs on your skin by washing with CHG (chlorahexidine gluconate) soap before surgery.   CHG is an antiseptic cleaner which kills germs and bonds with the skin to continue killing germs even after washing. Please DO NOT use if you have an allergy to CHG or antibacterial soaps.  If your skin becomes reddened/irritated stop using the CHG and inform your nurse when you arrive at Short Stay. You may shave your face/neck.  Please follow these instructions carefully:  1.  Shower with CHG Soap the night before surgery and the  morning of  Surgery.  2.  If you choose to wash your hair, wash your hair first as usual with your  normal  shampoo.  3.  After you shampoo, rinse your hair and body thoroughly to remove the  shampoo.                                        4.  Use CHG as you would any other liquid soap.  You can apply chg directly  to the skin and wash                       Gently with a scrungie or clean washcloth.  5.  Apply the CHG Soap to your body ONLY FROM THE NECK DOWN.   Do not use on face/ open                           Wound or open sores. Avoid contact with eyes, ears mouth and genitals (private parts).                       Wash face,  Genitals (private parts) with your normal soap.             6.  Wash thoroughly, paying special attention to the area where your surgery  will be performed.  7.  Thoroughly rinse your body with warm water from the neck down.  8.  DO NOT shower/wash with your normal soap after using and rinsing off  the CHG Soap.             9.  Pat yourself dry with a clean towel.            10.  Wear clean pajamas.            11.  Place clean sheets on your bed the night of your  first shower and do not  sleep with pets. Day of Surgery : Do not apply any lotions/deodorants the morning of surgery.  Please wear clean clothes to the hospital/surgery center.  FAILURE TO FOLLOW THESE INSTRUCTIONS MAY RESULT IN THE CANCELLATION OF YOUR SURGERY PATIENT SIGNATURE_________________________________  NURSE SIGNATURE__________________________________  ________________________________________________________________________

## 2020-03-19 ENCOUNTER — Encounter (HOSPITAL_COMMUNITY): Payer: Self-pay

## 2020-03-19 ENCOUNTER — Encounter (HOSPITAL_COMMUNITY)
Admission: RE | Admit: 2020-03-19 | Discharge: 2020-03-19 | Disposition: A | Discharge status: Home / Self Care | Payer: Medicare PPO | Source: Ambulatory Visit | Attending: Urology | Admitting: Urology

## 2020-03-19 ENCOUNTER — Other Ambulatory Visit: Payer: Self-pay

## 2020-03-19 DIAGNOSIS — Z01818 Encounter for other preprocedural examination: Secondary | ICD-10-CM | POA: Diagnosis not present

## 2020-03-19 DIAGNOSIS — I10 Essential (primary) hypertension: Secondary | ICD-10-CM | POA: Insufficient documentation

## 2020-03-19 HISTORY — DX: Gastro-esophageal reflux disease without esophagitis: K21.9

## 2020-03-19 LAB — BASIC METABOLIC PANEL
Anion gap: 9 (ref 5–15)
BUN: 42 mg/dL — ABNORMAL HIGH (ref 8–23)
CO2: 23 mmol/L (ref 22–32)
Calcium: 9.5 mg/dL (ref 8.9–10.3)
Chloride: 110 mmol/L (ref 98–111)
Creatinine, Ser: 3.02 mg/dL — ABNORMAL HIGH (ref 0.61–1.24)
GFR calc Af Amer: 22 mL/min — ABNORMAL LOW (ref >60)
GFR calc non Af Amer: 19 mL/min — ABNORMAL LOW (ref >60)
Glucose, Bld: 82 mg/dL (ref 70–99)
Potassium: 4.2 mmol/L (ref 3.5–5.1)
Sodium: 142 mmol/L (ref 135–145)

## 2020-03-19 LAB — CBC
HCT: 37.1 % — ABNORMAL LOW (ref 39.0–52.0)
Hemoglobin: 11.8 g/dL — ABNORMAL LOW (ref 13.0–17.0)
MCH: 30.8 pg (ref 26.0–34.0)
MCHC: 31.8 g/dL (ref 30.0–36.0)
MCV: 96.9 fL (ref 80.0–100.0)
Platelets: 287 10*3/uL (ref 150–400)
RBC: 3.83 MIL/uL — ABNORMAL LOW (ref 4.22–5.81)
RDW: 13.8 % (ref 11.5–15.5)
WBC: 7.9 10*3/uL (ref 4.0–10.5)
nRBC: 0 % (ref 0.0–0.2)

## 2020-03-19 NOTE — Progress Notes (Signed)
PCP - Dr. Nemiah Commander Cardiologist - no  Chest x-ray - no EKG - 03/19/20 Stress Test - no ECHO - no Cardiac Cath - no  Sleep Study - no CPAP -   Fasting Blood Sugar - NA Checks Blood Sugar _____ times a day  Blood Thinner Instructions:ASA Aspirin Instructions:Pt is not sure. I told him to call Dr. Alan Ripper office Last Dose:  Anesthesia review:   Patient denies shortness of breath, fever, cough and chest pain at PAT appointment  yes Patient verbalized understanding of instructions that were given to them at the PAT appointment. Patient was also instructed that they will need to review over the PAT instructions again at home before surgery. yes

## 2020-03-21 ENCOUNTER — Other Ambulatory Visit (HOSPITAL_COMMUNITY): Payer: Medicare PPO

## 2020-03-22 ENCOUNTER — Other Ambulatory Visit (HOSPITAL_COMMUNITY)
Admission: RE | Admit: 2020-03-22 | Discharge: 2020-03-22 | Disposition: A | Discharge status: Home / Self Care | Payer: Medicare PPO | Source: Ambulatory Visit | Attending: Urology | Admitting: Urology

## 2020-03-22 DIAGNOSIS — Z01812 Encounter for preprocedural laboratory examination: Secondary | ICD-10-CM | POA: Diagnosis not present

## 2020-03-22 DIAGNOSIS — Z20822 Contact with and (suspected) exposure to covid-19: Secondary | ICD-10-CM | POA: Diagnosis not present

## 2020-03-22 LAB — SARS CORONAVIRUS 2 (TAT 6-24 HRS): SARS Coronavirus 2: NEGATIVE

## 2020-03-24 NOTE — H&P (Signed)
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  Past Medical History:  Diagnosis Date  . Gastroenteritis   . GERD (gastroesophageal reflux disease)   . High cholesterol   . Hypertension     Past Surgical History:  Procedure Laterality Date  . APPENDECTOMY      Home Medications:  Allergies as of 03/24/2020   No Known Allergies     Medication List    Notice   Cannot display discharge medications because the patient has not yet been admitted.     Allergies: No Known Allergies  No family history on file.  Social History:  reports that he has never smoked. He has never used smokeless tobacco. He reports that he does not drink alcohol or use drugs.  ROS: A complete review of systems was performed.  All systems are negative except for pertinent findings as noted.  Physical Exam:  Vital signs in last 24 hours: There were no vitals taken for this visit. Constitutional:  Alert and oriented, No acute distress Cardiovascular: Regular rate  Respiratory: Normal respiratory effort GI: Abdomen is soft, nontender, nondistended, no abdominal masses. No CVAT.  Genitourinary: Normal male phallus, testes are descended bilaterally and non-tender and without masses, scrotum is normal in appearance without lesions or masses, perineum is normal on inspection. Lymphatic: No lymphadenopathy Neurologic: Grossly intact, no focal deficits Psychiatric: Normal mood and affect  Laboratory Data:  No results for input(s): WBC, HGB, HCT, PLT in the last 72 hours.  No results for input(s): NA, K, CL, GLUCOSE, BUN, CALCIUM, CREATININE in the last 72 hours.  Invalid input(s): CO3   No results found for this or any previous visit (from the past 24 hour(s)). Recent Results (from the past 240 hour(s))  SARS CORONAVIRUS 2 (TAT 6-24 HRS) Nasopharyngeal  Nasopharyngeal Swab     Status: None   Collection Time: 03/22/20 10:53 AM   Specimen: Nasopharyngeal Swab  Result Value Ref Range Status   SARS Coronavirus 2 NEGATIVE NEGATIVE Final    Comment: (NOTE) SARS-CoV-2 target nucleic acids are NOT DETECTED. The SARS-CoV-2 RNA is generally detectable in upper and lower respiratory specimens during the acute phase of infection. Negative results do not preclude SARS-CoV-2 infection, do not rule out co-infections with other pathogens, and should not be used as the sole basis for treatment or other patient management decisions. Negative results must be combined with clinical observations, patient history, and epidemiological information. The expected result is Negative. Fact Sheet for Patients: SugarRoll.be Fact Sheet for Healthcare Providers: https://www.woods-mathews.com/ This test is not yet approved or cleared by the [place] FDA and  has been authorized for detection and/or diagnosis of SARS-CoV-2 by FDA under an Emergency Use Authorization (EUA). This EUA will remain  in effect (meaning this test can be used) for the duration of the COVID-19 declaration under Section 56 4(b)(1) of the Act, 21 U.S.C. section 360bbb-3(b)(1), unless the authorization is terminated or revoked sooner. Performed at [facility], [place] [address], [place], [place] [address]     Renal Function: Recent Labs    03/19/20 1526  CREATININE 3.02*   Estimated Creatinine Clearance: 22.5 mL/min (A) (by C-G formula based on SCr of 3.02 mg/dL (H)).  Radiologic Imaging: No results found.  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

## 2020-03-25 ENCOUNTER — Ambulatory Visit (HOSPITAL_COMMUNITY)
Admission: RE | Admit: 2020-03-25 | Discharge: 2020-03-25 | Disposition: A | Discharge status: Home / Self Care | Payer: Medicare PPO | Attending: Urology | Admitting: Urology

## 2020-03-25 ENCOUNTER — Ambulatory Visit (HOSPITAL_COMMUNITY): Payer: Medicare PPO | Admitting: Physician Assistant

## 2020-03-25 ENCOUNTER — Ambulatory Visit (HOSPITAL_COMMUNITY): Payer: Medicare PPO | Admitting: Anesthesiology

## 2020-03-25 ENCOUNTER — Ambulatory Visit (HOSPITAL_COMMUNITY): Payer: Medicare PPO

## 2020-03-25 ENCOUNTER — Encounter (HOSPITAL_COMMUNITY): Payer: Self-pay | Admitting: Urology

## 2020-03-25 ENCOUNTER — Other Ambulatory Visit: Payer: Self-pay

## 2020-03-25 ENCOUNTER — Encounter (HOSPITAL_COMMUNITY)
Admission: RE | Disposition: A | Discharge status: Home / Self Care | Payer: Self-pay | Source: Home / Self Care | Attending: Urology

## 2020-03-25 DIAGNOSIS — I10 Essential (primary) hypertension: Secondary | ICD-10-CM | POA: Diagnosis not present

## 2020-03-25 DIAGNOSIS — E7800 Pure hypercholesterolemia, unspecified: Secondary | ICD-10-CM | POA: Diagnosis not present

## 2020-03-25 DIAGNOSIS — N130 Hydronephrosis with ureteropelvic junction obstruction: Secondary | ICD-10-CM | POA: Insufficient documentation

## 2020-03-25 DIAGNOSIS — K219 Gastro-esophageal reflux disease without esophagitis: Secondary | ICD-10-CM | POA: Diagnosis not present

## 2020-03-25 DIAGNOSIS — N138 Other obstructive and reflux uropathy: Secondary | ICD-10-CM | POA: Diagnosis present

## 2020-03-25 DIAGNOSIS — N189 Chronic kidney disease, unspecified: Secondary | ICD-10-CM | POA: Diagnosis not present

## 2020-03-25 DIAGNOSIS — I129 Hypertensive chronic kidney disease with stage 1 through stage 4 chronic kidney disease, or unspecified chronic kidney disease: Secondary | ICD-10-CM | POA: Diagnosis not present

## 2020-03-25 DIAGNOSIS — N1330 Unspecified hydronephrosis: Secondary | ICD-10-CM | POA: Diagnosis not present

## 2020-03-25 HISTORY — PX: CYSTOSCOPY W/ URETERAL STENT PLACEMENT: SHX1429

## 2020-03-25 SURGERY — CYSTOSCOPY, WITH RETROGRADE PYELOGRAM AND URETERAL STENT INSERTION
Anesthesia: General | Laterality: Left

## 2020-03-25 MED ORDER — STERILE WATER FOR IRRIGATION IR SOLN
Status: DC | PRN
  Administered 2020-03-25: 3000 mL

## 2020-03-25 MED ORDER — ACETAMINOPHEN 500 MG PO TABS
1000.0000 mg | ORAL_TABLET | Freq: Once | ORAL | Status: AC
  Administered 2020-03-25: 1000 mg via ORAL
  Filled 2020-03-25: qty 2

## 2020-03-25 MED ORDER — PHENYLEPHRINE HCL (PRESSORS) 10 MG/ML IV SOLN
INTRAVENOUS | Status: DC | PRN
  Administered 2020-03-25: 80 ug via INTRAVENOUS

## 2020-03-25 MED ORDER — IOHEXOL 300 MG/ML  SOLN
INTRAMUSCULAR | Status: DC | PRN
  Administered 2020-03-25: 10 mL

## 2020-03-25 MED ORDER — SODIUM CHLORIDE 0.9 % IV SOLN: INTRAVENOUS | Status: DC

## 2020-03-25 MED ORDER — ONDANSETRON HCL 4 MG/2ML IJ SOLN
INTRAMUSCULAR | Status: AC
  Filled 2020-03-25: qty 2

## 2020-03-25 MED ORDER — LIDOCAINE HCL (CARDIAC) PF 100 MG/5ML IV SOSY
PREFILLED_SYRINGE | INTRAVENOUS | Status: DC | PRN
Start: 2020-03-25 — End: 2020-03-25
  Administered 2020-03-25: 60 mg via INTRATRACHEAL

## 2020-03-25 MED ORDER — LACTATED RINGERS IV SOLN: INTRAVENOUS | Status: DC

## 2020-03-25 MED ORDER — LIDOCAINE 2% (20 MG/ML) 5 ML SYRINGE
INTRAMUSCULAR | Status: AC
  Filled 2020-03-25: qty 5

## 2020-03-25 MED ORDER — SODIUM CHLORIDE 0.9 % IR SOLN
Status: DC | PRN
  Administered 2020-03-25: 1000 mL

## 2020-03-25 MED ORDER — CEPHALEXIN 250 MG PO CAPS: 250.0000 mg | ORAL_CAPSULE | Freq: Two times a day (BID) | ORAL | 0 refills | Status: DC

## 2020-03-25 MED ORDER — ONDANSETRON HCL 4 MG/2ML IJ SOLN
INTRAMUSCULAR | Status: DC | PRN
  Administered 2020-03-25: 4 mg via INTRAVENOUS

## 2020-03-25 MED ORDER — PROPOFOL 10 MG/ML IV BOLUS
INTRAVENOUS | Status: DC | PRN
  Administered 2020-03-25: 150 mg via INTRAVENOUS

## 2020-03-25 MED ORDER — OXYCODONE HCL 5 MG/5ML PO SOLN
ORAL | Status: AC
  Filled 2020-03-25: qty 5

## 2020-03-25 MED ORDER — ONDANSETRON HCL 4 MG/2ML IJ SOLN: 4.0000 mg | Freq: Once | INTRAMUSCULAR | Status: DC | PRN

## 2020-03-25 MED ORDER — FENTANYL CITRATE (PF) 100 MCG/2ML IJ SOLN
INTRAMUSCULAR | Status: DC | PRN
  Administered 2020-03-25 (×2): 50 ug via INTRAVENOUS

## 2020-03-25 MED ORDER — PROPOFOL 10 MG/ML IV BOLUS
INTRAVENOUS | Status: AC
  Filled 2020-03-25: qty 20

## 2020-03-25 MED ORDER — OXYBUTYNIN CHLORIDE 5 MG PO TABS: 5.0000 mg | ORAL_TABLET | Freq: Three times a day (TID) | ORAL | 1 refills | Status: DC | PRN

## 2020-03-25 MED ORDER — FENTANYL CITRATE (PF) 100 MCG/2ML IJ SOLN
INTRAMUSCULAR | Status: AC
  Filled 2020-03-25: qty 2

## 2020-03-25 MED ORDER — OXYCODONE HCL 5 MG/5ML PO SOLN
5.0000 mg | Freq: Once | ORAL | Status: AC
  Administered 2020-03-25: 5 mg via ORAL

## 2020-03-25 MED ORDER — CEFAZOLIN SODIUM-DEXTROSE 2-4 GM/100ML-% IV SOLN
2.0000 g | INTRAVENOUS | Status: AC
  Administered 2020-03-25: 2 g via INTRAVENOUS
  Filled 2020-03-25: qty 100

## 2020-03-25 MED ORDER — FENTANYL CITRATE (PF) 100 MCG/2ML IJ SOLN
25.0000 ug | INTRAMUSCULAR | Status: DC | PRN
  Administered 2020-03-25: 50 ug via INTRAVENOUS

## 2020-03-25 SURGICAL SUPPLY — 12 items
BAG URO CATCHER STRL LF (MISCELLANEOUS) ×2 IMPLANT
CATH INTERMIT  6FR 70CM (CATHETERS) IMPLANT
CLOTH BEACON ORANGE TIMEOUT ST (SAFETY) ×2 IMPLANT
GLOVE BIOGEL M 8.0 STRL (GLOVE) ×2 IMPLANT
GOWN STRL REUS W/TWL XL LVL3 (GOWN DISPOSABLE) ×2 IMPLANT
GUIDEWIRE ANG ZIPWIRE 038X150 (WIRE) ×1 IMPLANT
GUIDEWIRE STR DUAL SENSOR (WIRE) ×2 IMPLANT
KIT TURNOVER KIT A (KITS) IMPLANT
MANIFOLD NEPTUNE II (INSTRUMENTS) ×2 IMPLANT
STENT URET 6FRX28 CONTOUR (STENTS) ×1 IMPLANT
TRAY CYSTO PACK (CUSTOM PROCEDURE TRAY) ×2 IMPLANT
TUBING CONNECTING 10 (TUBING) ×2 IMPLANT

## 2020-03-25 NOTE — Anesthesia Procedure Notes (Signed)
Procedure Name: LMA Insertion Date/Time: 03/25/2020 8:49 AM Performed by: Niel Hummer, CRNA Pre-anesthesia Checklist: Patient identified, Emergency Drugs available, Suction available and Patient being monitored Patient Re-evaluated:Patient Re-evaluated prior to induction Oxygen Delivery Method: Circle system utilized Preoxygenation: Pre-oxygenation with 100% oxygen Induction Type: IV induction LMA: LMA with gastric port inserted LMA Size: 4.0 Number of attempts: 1 Dental Injury: Teeth and Oropharynx as per pre-operative assessment

## 2020-03-25 NOTE — Transfer of Care (Signed)
Immediate Anesthesia Transfer of Care Note  Patient: Joshua Farmer  Procedure(s) Performed: CYSTOSCOPY WITH RETROGRADE PYELOGRAM/URETERAL STENT PLACEMENT (Left )  Patient Location: PACU  Anesthesia Type:General  Level of Consciousness: awake  Airway & Oxygen Therapy: Patient Spontanous Breathing and Patient connected to face mask oxygen  Post-op Assessment: Report given to RN, Post -op Vital signs reviewed and stable and Patient moving all extremities X 4  Post vital signs: Reviewed and stable  Last Vitals:  Vitals Value Taken Time  BP    Temp    Pulse 70 03/25/20 0928  Resp 18 03/25/20 0928  SpO2 100 % 03/25/20 0928  Vitals shown include unvalidated device data.  Last Pain:  Vitals:   03/25/20 0642  TempSrc:   PainSc: 5       Patients Stated Pain Goal: 4 ([id number])  Complications: No apparent anesthesia complications

## 2020-03-25 NOTE — Interval H&P Note (Signed)
History and Physical Interval Note:  03/25/2020 8:18 AM  [name]  has presented today for surgery, with the diagnosis of LEFT HYDRONEPHROSIS.  The various methods of treatment have been discussed with the patient and family. After consideration of risks, benefits and other options for treatment, the patient has consented to  Procedure(s) with comments: [place] (Left) - 30 MINS as a surgical intervention.  The patient's history has been reviewed, patient examined, no change in status, stable for surgery.  I have reviewed the patient's chart and labs.  Questions were answered to the patient's satisfaction.     Joshua Farmer

## 2020-03-25 NOTE — Op Note (Signed)
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  Lasix renogram confirms inadequate drainage of the left renal unit.  As the patient does have symptomatic pain with this, he presents at this time for cystoscopy, retrograde study of this, possible ureteroscopy and double-J stent placement.  This will be followed down the road with repeat Lasix renogram to check renal function/split functions, as well as emptying.  I discussed the procedure as well as risks and complications with the patient.  He understands these and desires to proceed.  Findings: The urethra was normal.  Prostate minimally obstructive.  Bladder entered and inspected circumferentially.  No tumors, foreign bodies, trabeculations.  Ureteral orifice ease were normal in their configuration location.  Retrograde study of the left ureter revealed a normal mid and distal ureter.  There was a significant tortuosity/loop in the proximal ureter at approximately the area of the UPJ.  Contrast did fill the dilated left renal pelvis.  There were no obvious filling defects.  Description of procedure: The patient was properly identified and marked in the holding area.  He received preoperative IV antibiotics.  Was taken to the operating room where general anesthetic was administered.  He was placed in the dorsolithotomy position.  Genitalia and perineum were prepped and draped.  Proper timeout performed.  25 French panendoscope was advanced into the patient's bladder through the  urethra with normal inspection seen.  Left retrograde ureteropyelogram performed with a 6 French open-ended catheter and Omnipaque with the above-mentioned findings.  I negotiated a zip wire into the dilated left renal pelvis using fluoroscopic guidance.  I then passed the open-ended catheter over this, and remove the zip wire.  There was a hydronephrotic drip.  A sensor tip guidewire was then placed and the open-ended catheter removed.  I did decide on performing rigid ureteroscopy which was done with a 6 French dual-lumen scope.  There were no obvious ureteral abnormalities.  I did guide this directly into the renal pelvis which was confirmed with fluoroscopy.  No renal pelvic lesions were seen.  At this point, I removed the ureteroscope.  I then backloaded the guidewire through the cystoscope, and placed a 28 cm x 6 French contour double-J stent.  The tether had been removed.  Once adequate positioning was seen within the renal pelvis, the guidewire was removed.  Excellent proximal and distal curls were seen using fluoroscopy and cystoscopy, respectively.  At this time, the bladder was drained, the scope removed, and the procedure terminated.  The patient was awakened.  He was taken to the PACU in stable condition, having tolerated procedure well.

## 2020-03-25 NOTE — Anesthesia Postprocedure Evaluation (Signed)
Anesthesia Post Note  Patient: Joshua Farmer  Procedure(s) Performed: CYSTOSCOPY WITH RETROGRADE PYELOGRAM/URETERAL STENT PLACEMENT (Left )     Patient location during evaluation: PACU Anesthesia Type: General Level of consciousness: awake and alert, oriented and patient cooperative Pain management: pain level controlled Vital Signs Assessment: post-procedure vital signs reviewed and stable Respiratory status: spontaneous breathing, nonlabored ventilation and respiratory function stable Cardiovascular status: blood pressure returned to baseline and stable Postop Assessment: no apparent nausea or vomiting Anesthetic complications: no    Last Vitals:  Vitals:   03/25/20 0930 03/25/20 0945  BP:  (!) 155/87  Pulse: 64 73  Resp: 15 15  Temp:    SpO2: 100% 100%    Last Pain:  Vitals:   03/25/20 0945  TempSrc:   PainSc: [place]

## 2020-03-25 NOTE — Anesthesia Preprocedure Evaluation (Addendum)
Anesthesia Evaluation  Patient identified by MRN, date of birth, ID band Patient awake    Reviewed: Allergy & Precautions, NPO status , Patient's Chart, lab work & pertinent test results  Airway Mallampati: II  TM Distance: >3 FB Neck ROM: Full    Dental no notable dental hx. (+) Teeth Intact, Dental Advisory Given   Pulmonary asthma ,    Pulmonary exam normal breath sounds clear to auscultation       Cardiovascular hypertension, Pt. on medications negative cardio ROS Normal cardiovascular exam Rhythm:Regular Rate:Normal     Neuro/Psych negative neurological ROS  negative psych ROS   GI/Hepatic Neg liver ROS, GERD  Medicated and Controlled,  Endo/Other  negative endocrine ROS  Renal/GU CRF and ARFRenal diseaseL hydronephrosis  Cr 3  negative genitourinary   Musculoskeletal negative musculoskeletal ROS (+)   Abdominal Normal abdominal exam  (+)   Peds  Hematology negative hematology ROS (+)   Anesthesia Other Findings   Reproductive/Obstetrics negative OB ROS                             Anesthesia Physical Anesthesia Plan  ASA: III  Anesthesia Plan: General   Post-op Pain Management:    Induction: Intravenous  PONV Risk Score and Plan: 3 and Ondansetron, Dexamethasone and Treatment may vary due to age or medical condition  Airway Management Planned: LMA  Additional Equipment: None  Intra-op Plan:   Post-operative Plan: Extubation in OR  Informed Consent: I have reviewed the patients History and Physical, chart, labs and discussed the procedure including the risks, benefits and alternatives for the proposed anesthesia with the patient or authorized representative who has indicated his/her understanding and acceptance.     Dental advisory given  Plan Discussed with: CRNA  Anesthesia Plan Comments:         Anesthesia Quick Evaluation

## 2020-03-25 NOTE — Addendum Note (Signed)
Addendum  created 03/25/20 1053 by Pervis Hocking, DO   Order list changed

## 2020-03-25 NOTE — Discharge Instructions (Signed)
1. You may see some blood in the urine and may have some burning with urination for 48-72 hours. You also may notice that you have to urinate more frequently or urgently after your procedure which is normal.  2. You should call should you develop an inability urinate, fever > 101, persistent nausea and vomiting that prevents you from eating or drinking to stay hydrated.  3. If you have a stent, you will likely urinate more frequently and urgently until the stent is removed and you may experience some discomfort/pain in the lower abdomen and flank especially when urinating. You may take pain medication prescribed to you if needed for pain. You may also intermittently have blood in the urine until the stent is removed.        General Anesthesia, Adult, Care After This sheet gives you information about how to care for yourself after your procedure. Your health care provider may also give you more specific instructions. If you have problems or questions, contact your health care provider. What can I expect after the procedure? After the procedure, the following side effects are common:  Pain or discomfort at the IV site.  Nausea.  Vomiting.  Sore throat.  Trouble concentrating.  Feeling cold or chills.  Weak or tired.  Sleepiness and fatigue.  Soreness and body aches. These side effects can affect parts of the body that were not involved in surgery. Follow these instructions at home:  For at least 24 hours after the procedure:  Have a responsible adult stay with you. It is important to have someone help care for you until you are awake and alert.  Rest as needed.  Do not: ? Participate in activities in which you could fall or become injured. ? Drive. ? Use heavy machinery. ? Drink alcohol. ? Take sleeping pills or medicines that cause drowsiness. ? Make important decisions or sign legal documents. ? Take care of children on your own. Eating and drinking  Follow any  instructions from your health care provider about eating or drinking restrictions.  When you feel hungry, start by eating small amounts of foods that are soft and easy to digest (bland), such as toast. Gradually return to your regular diet.  Drink enough fluid to keep your urine pale yellow.  If you vomit, rehydrate by drinking water, juice, or clear broth. General instructions  If you have sleep apnea, surgery and certain medicines can increase your risk for breathing problems. Follow instructions from your health care provider about wearing your sleep device: ? Anytime you are sleeping, including during daytime naps. ? While taking prescription pain medicines, sleeping medicines, or medicines that make you drowsy.  Return to your normal activities as told by your health care provider. Ask your health care provider what activities are safe for you.  Take over-the-counter and prescription medicines only as told by your health care provider.  If you smoke, do not smoke without supervision.  Keep all follow-up visits as told by your health care provider. This is important. Contact a health care provider if:  You have nausea or vomiting that does not get better with medicine.  You cannot eat or drink without vomiting.  You have pain that does not get better with medicine.  You are unable to pass urine.  You develop a skin rash.  You have a fever.  You have redness around your IV site that gets worse. Get help right away if:  You have difficulty breathing.  You have chest pain.    You have blood in your urine or stool, or you vomit blood. Summary  After the procedure, it is common to have a sore throat or nausea. It is also common to feel tired.  Have a responsible adult stay with you for the first 24 hours after general anesthesia. It is important to have someone help care for you until you are awake and alert.  When you feel hungry, start by eating small amounts of foods  that are soft and easy to digest (bland), such as toast. Gradually return to your regular diet.  Drink enough fluid to keep your urine pale yellow.  Return to your normal activities as told by your health care provider. Ask your health care provider what activities are safe for you. This information is not intended to replace advice given to you by your health care provider. Make sure you discuss any questions you have with your health care provider. Document Revised: 10/22/2017 Document Reviewed: 06/04/2017 Elsevier Patient Education  2020 Elsevier Inc.  

## 2020-04-08 DIAGNOSIS — N39498 Other specified urinary incontinence: Secondary | ICD-10-CM | POA: Diagnosis not present

## 2020-04-08 DIAGNOSIS — I10 Essential (primary) hypertension: Secondary | ICD-10-CM | POA: Diagnosis not present

## 2020-04-08 DIAGNOSIS — N401 Enlarged prostate with lower urinary tract symptoms: Secondary | ICD-10-CM | POA: Diagnosis not present

## 2020-04-08 DIAGNOSIS — Z6829 Body mass index (BMI) 29.0-29.9, adult: Secondary | ICD-10-CM | POA: Diagnosis not present

## 2020-04-08 DIAGNOSIS — K2100 Gastro-esophageal reflux disease with esophagitis, without bleeding: Secondary | ICD-10-CM | POA: Diagnosis not present

## 2020-04-08 DIAGNOSIS — Z131 Encounter for screening for diabetes mellitus: Secondary | ICD-10-CM | POA: Diagnosis not present

## 2020-04-12 DIAGNOSIS — N270 Small kidney, unilateral: Secondary | ICD-10-CM | POA: Diagnosis not present

## 2020-04-12 DIAGNOSIS — N130 Hydronephrosis with ureteropelvic junction obstruction: Secondary | ICD-10-CM | POA: Diagnosis not present

## 2020-04-12 DIAGNOSIS — R300 Dysuria: Secondary | ICD-10-CM | POA: Diagnosis not present

## 2020-04-17 ENCOUNTER — Other Ambulatory Visit: Payer: Self-pay | Admitting: Urology

## 2020-04-17 ENCOUNTER — Other Ambulatory Visit (HOSPITAL_COMMUNITY): Payer: Self-pay | Admitting: Urology

## 2020-04-17 DIAGNOSIS — N130 Hydronephrosis with ureteropelvic junction obstruction: Secondary | ICD-10-CM

## 2020-05-09 ENCOUNTER — Other Ambulatory Visit: Payer: Self-pay

## 2020-05-09 ENCOUNTER — Ambulatory Visit (HOSPITAL_COMMUNITY)
Admission: RE | Admit: 2020-05-09 | Discharge: 2020-05-09 | Disposition: A | Discharge status: Home / Self Care | Payer: Medicare PPO | Source: Ambulatory Visit | Attending: Urology | Admitting: Urology

## 2020-05-09 DIAGNOSIS — N2889 Other specified disorders of kidney and ureter: Secondary | ICD-10-CM | POA: Diagnosis not present

## 2020-05-09 DIAGNOSIS — N130 Hydronephrosis with ureteropelvic junction obstruction: Secondary | ICD-10-CM | POA: Insufficient documentation

## 2020-05-09 DIAGNOSIS — C679 Malignant neoplasm of bladder, unspecified: Secondary | ICD-10-CM | POA: Diagnosis not present

## 2020-05-09 MED ORDER — FUROSEMIDE 10 MG/ML IJ SOLN
INTRAMUSCULAR | Status: AC
  Filled 2020-05-09: qty 8

## 2020-05-09 MED ORDER — TECHNETIUM TC 99M MERTIATIDE
5.2000 | Freq: Once | INTRAVENOUS | Status: AC
  Administered 2020-05-09: 5.2 via INTRAVENOUS

## 2020-06-03 DIAGNOSIS — Z6830 Body mass index (BMI) 30.0-30.9, adult: Secondary | ICD-10-CM | POA: Diagnosis not present

## 2020-06-03 DIAGNOSIS — N401 Enlarged prostate with lower urinary tract symptoms: Secondary | ICD-10-CM | POA: Diagnosis not present

## 2020-06-24 DIAGNOSIS — N270 Small kidney, unilateral: Secondary | ICD-10-CM | POA: Diagnosis not present

## 2020-06-24 DIAGNOSIS — N130 Hydronephrosis with ureteropelvic junction obstruction: Secondary | ICD-10-CM | POA: Diagnosis not present

## 2020-07-01 DIAGNOSIS — N3091 Cystitis, unspecified with hematuria: Secondary | ICD-10-CM | POA: Diagnosis not present

## 2020-07-01 DIAGNOSIS — M25571 Pain in right ankle and joints of right foot: Secondary | ICD-10-CM | POA: Diagnosis not present

## 2020-07-01 DIAGNOSIS — Z6829 Body mass index (BMI) 29.0-29.9, adult: Secondary | ICD-10-CM | POA: Diagnosis not present

## 2020-07-10 ENCOUNTER — Other Ambulatory Visit: Payer: Self-pay | Admitting: Urology

## 2020-07-16 DIAGNOSIS — Z6829 Body mass index (BMI) 29.0-29.9, adult: Secondary | ICD-10-CM | POA: Diagnosis not present

## 2020-07-16 DIAGNOSIS — N3091 Cystitis, unspecified with hematuria: Secondary | ICD-10-CM | POA: Diagnosis not present

## 2020-07-24 NOTE — Patient Instructions (Addendum)
DUE TO COVID-19 ONLY ONE VISITOR IS ALLOWED TO COME WITH YOU AND STAY IN THE WAITING ROOM ONLY DURING PRE OP AND PROCEDURE DAY OF SURGERY. THE 1 VISITOR  MAY VISIT WITH YOU AFTER SURGERY IN YOUR PRIVATE ROOM DURING VISITING HOURS ONLY!  YOU NEED TO HAVE A COVID 19 TEST ON_9/28______ @_10 :10______, THIS TEST MUST BE DONE BEFORE SURGERY,  COVID TESTING SITE [place] [address], IT IS ON THE RIGHT GOING OUT [address] APPROXIMATELY  2 MINUTES PAST [employer] ON THE RIGHT. ONCE YOUR COVID TEST IS COMPLETED,  PLEASE BEGIN THE QUARANTINE INSTRUCTIONS AS OUTLINED IN YOUR HANDOUT.                Joshua Farmer    Your procedure is scheduled on: 08/02/20   Report to [facility] Main  Entrance   Report to Short Stay at 5:30 AM     Call this number if you have problems the morning of surgery [phone]    Remember: Do not eat food or drink liquids :After Midnight.   BRUSH YOUR TEETH MORNING OF SURGERY AND RINSE YOUR MOUTH OUT, NO CHEWING GUM CANDY OR MINTS.     Take these medicines the morning of surgery with A SIP OF WATER: Amlodipine, Nexium, Myrbetriq,             use your inhaler and bring with you to the hospital                                You may not have any metal on your body including               piercings  Do not wear jewelry, lotions, powders or deodorant                       Men may shave face and neck.   Do not bring valuables to the hospital. [place].  Contacts, dentures or bridgework may not be worn into surgery.      Patients discharged the day of surgery will not be allowed to drive home.   IF YOU ARE HAVING SURGERY AND GOING HOME THE SAME DAY, YOU MUST HAVE AN ADULT TO DRIVE YOU HOME AND BE WITH YOU FOR 24 HOURS.   YOU MAY GO HOME BY TAXI OR UBER OR ORTHERWISE, BUT AN ADULT MUST ACCOMPANY YOU HOME AND STAY WITH YOU FOR 24 HOURS.  Name and phone number of your  driver:  Special Instructions: N/A              Please read over the following fact sheets you were given: _____________________________________________________________________             [facility] - Preparing for Surgery Before surgery, you can play an important role.   Because skin is not sterile, your skin needs to be as free of germs as possible.   You can reduce the number of germs on your skin by washing with CHG (chlorahexidine gluconate) soap before surgery.   CHG is an antiseptic cleaner which kills germs and bonds with the skin to continue killing germs even after washing. Please DO NOT use if you have an allergy to CHG or antibacterial soaps.   If your skin becomes reddened/irritated stop using  the CHG and inform your nurse when you arrive at Short Stay.  You may shave your face/neck.  Please follow these instructions carefully:  1.  Shower with CHG Soap the night before surgery and the  morning of Surgery.  2.  If you choose to wash your hair, wash your hair first as usual with your  normal  shampoo.  3.  After you shampoo, rinse your hair and body thoroughly to remove the  shampoo.                                        4.  Use CHG as you would any other liquid soap.  You can apply chg directly  to the skin and wash                       Gently with a scrungie or clean washcloth.  5.  Apply the CHG Soap to your body ONLY FROM THE NECK DOWN.   Do not use on face/ open                           Wound or open sores. Avoid contact with eyes, ears mouth and genitals (private parts).                       Wash face,  Genitals (private parts) with your normal soap.             6.  Wash thoroughly, paying special attention to the area where your surgery  will be performed.  7.  Thoroughly rinse your body with warm water from the neck down.  8.  DO NOT shower/wash with your normal soap after using and rinsing off  the CHG Soap.             9.  Pat yourself dry with a clean  towel.            10.  Wear clean pajamas.            11.  Place clean sheets on your bed the night of your first shower and do not  sleep with pets. Day of Surgery : Do not apply any lotions/deodorants the morning of surgery.  Please wear clean clothes to the hospital/surgery center.  FAILURE TO FOLLOW THESE INSTRUCTIONS MAY RESULT IN THE CANCELLATION OF YOUR SURGERY PATIENT SIGNATURE_________________________________  NURSE SIGNATURE__________________________________  ________________________________________________________________________

## 2020-07-26 ENCOUNTER — Encounter (INDEPENDENT_AMBULATORY_CARE_PROVIDER_SITE_OTHER): Payer: Self-pay

## 2020-07-26 ENCOUNTER — Encounter (HOSPITAL_COMMUNITY)
Admission: RE | Admit: 2020-07-26 | Discharge: 2020-07-26 | Disposition: A | Discharge status: Home / Self Care | Payer: Medicare PPO | Source: Ambulatory Visit | Attending: Urology | Admitting: Urology

## 2020-07-26 ENCOUNTER — Encounter (HOSPITAL_COMMUNITY): Payer: Self-pay

## 2020-07-26 ENCOUNTER — Other Ambulatory Visit: Payer: Self-pay

## 2020-07-26 DIAGNOSIS — Z01812 Encounter for preprocedural laboratory examination: Secondary | ICD-10-CM | POA: Insufficient documentation

## 2020-07-26 LAB — COMPREHENSIVE METABOLIC PANEL
ALT: 14 U/L (ref 0–44)
AST: 18 U/L (ref 15–41)
Albumin: 4 g/dL (ref 3.5–5.0)
Alkaline Phosphatase: 56 U/L (ref 38–126)
Anion gap: 8 (ref 5–15)
BUN: 41 mg/dL — ABNORMAL HIGH (ref 8–23)
CO2: 24 mmol/L (ref 22–32)
Calcium: 9.8 mg/dL (ref 8.9–10.3)
Chloride: 111 mmol/L (ref 98–111)
Creatinine, Ser: 2.57 mg/dL — ABNORMAL HIGH (ref 0.61–1.24)
GFR calc Af Amer: 27 mL/min — ABNORMAL LOW (ref >60)
GFR calc non Af Amer: 23 mL/min — ABNORMAL LOW (ref >60)
Glucose, Bld: 94 mg/dL (ref 70–99)
Potassium: 4.4 mmol/L (ref 3.5–5.1)
Sodium: 143 mmol/L (ref 135–145)
Total Bilirubin: 0.6 mg/dL (ref 0.3–1.2)
Total Protein: 7.2 g/dL (ref 6.5–8.1)

## 2020-07-26 LAB — CBC
HCT: 39 % (ref 39.0–52.0)
Hemoglobin: 12.4 g/dL — ABNORMAL LOW (ref 13.0–17.0)
MCH: 31.1 pg (ref 26.0–34.0)
MCHC: 31.8 g/dL (ref 30.0–36.0)
MCV: 97.7 fL (ref 80.0–100.0)
Platelets: 299 10*3/uL (ref 150–400)
RBC: 3.99 MIL/uL — ABNORMAL LOW (ref 4.22–5.81)
RDW: 13.7 % (ref 11.5–15.5)
WBC: 7.6 10*3/uL (ref 4.0–10.5)
nRBC: 0 % (ref 0.0–0.2)

## 2020-07-26 NOTE — Progress Notes (Signed)
COVID Vaccine Completed:yes Date COVID Vaccine completed:12/27/19 COVID vaccine manufacturer:   Moderna   PCP - Dr. Nemiah Commander Cardiologist - no  Chest x-ray - no EKG - 03/19/20-Epic Stress Test - no ECHO - no Cardiac Cath - no Pacemaker/ICD device last checked:NA  Sleep Study - yes CPAP - yes  Fasting Blood Sugar - NA Checks Blood Sugar _____ times a day  Blood Thinner Instructions:ASA/ Hasanaj Aspirin Instructions:stop 5 days prior to DOS/ Louis Meckel Last Dose:  Anesthesia review:   Patient denies shortness of breath, fever, cough and chest pain at PAT appointment yes   Patient verbalized understanding of instructions that were given to them at the PAT appointment. Patient was also instructed that they will need to review over the PAT instructions again at home before surgery. Yes Pt walks and reports no SOB climbing stairs, doing work or with ADLs

## 2020-07-27 LAB — URINE CULTURE: Culture: <10000 — AB

## 2020-07-30 ENCOUNTER — Other Ambulatory Visit (HOSPITAL_COMMUNITY)
Admission: RE | Admit: 2020-07-30 | Discharge: 2020-07-30 | Disposition: A | Discharge status: Home / Self Care | Payer: Medicare PPO | Source: Ambulatory Visit | Attending: Urology | Admitting: Urology

## 2020-07-30 ENCOUNTER — Encounter (HOSPITAL_COMMUNITY): Admission: RE | Admit: 2020-07-30 | Payer: Medicare PPO | Source: Ambulatory Visit

## 2020-07-30 DIAGNOSIS — Z01812 Encounter for preprocedural laboratory examination: Secondary | ICD-10-CM | POA: Diagnosis not present

## 2020-07-30 DIAGNOSIS — Z20822 Contact with and (suspected) exposure to covid-19: Secondary | ICD-10-CM | POA: Diagnosis not present

## 2020-07-30 LAB — SARS CORONAVIRUS 2 (TAT 6-24 HRS): SARS Coronavirus 2: NEGATIVE

## 2020-08-01 ENCOUNTER — Encounter (HOSPITAL_COMMUNITY): Payer: Self-pay | Admitting: Urology

## 2020-08-01 NOTE — Anesthesia Preprocedure Evaluation (Addendum)
Anesthesia Evaluation  Patient identified by MRN, date of birth, ID band Patient awake    Reviewed: Allergy & Precautions, NPO status , Patient's Chart, lab work & pertinent test results, reviewed documented beta blocker date and time   Airway Mallampati: II  TM Distance: >3 FB Neck ROM: Full    Dental no notable dental hx. (+) Dental Advisory Given   Pulmonary neg pulmonary ROS,    Pulmonary exam normal breath sounds clear to auscultation       Cardiovascular hypertension, Pt. on medications Normal cardiovascular exam Rhythm:Regular Rate:Normal  EKG 03/19/20 NSR, normal   Neuro/Psych negative neurological ROS  negative psych ROS   GI/Hepatic Neg liver ROS, GERD  Medicated and Controlled,  Endo/Other  Hyperlipidemia  Renal/GU negative Renal ROSLeft UPJ obstruction Hx/o renal calculi  negative genitourinary   Musculoskeletal negative musculoskeletal ROS (+)   Abdominal   Peds  Hematology negative hematology ROS (+)   Anesthesia Other Findings   Reproductive/Obstetrics                            Anesthesia Physical Anesthesia Plan  ASA: II  Anesthesia Plan: General   Post-op Pain Management:    Induction: Intravenous  PONV Risk Score and Plan: 4 or greater and Ondansetron, Treatment may vary due to age or medical condition and Dexamethasone  Airway Management Planned: Oral ETT  Additional Equipment:   Intra-op Plan:   Post-operative Plan: Extubation in OR  Informed Consent: I have reviewed the patients History and Physical, chart, labs and discussed the procedure including the risks, benefits and alternatives for the proposed anesthesia with the patient or authorized representative who has indicated his/her understanding and acceptance.     Dental advisory given  Plan Discussed with: CRNA and Anesthesiologist  Anesthesia Plan Comments:        Anesthesia Quick  Evaluation

## 2020-08-02 ENCOUNTER — Ambulatory Visit (HOSPITAL_COMMUNITY): Payer: Medicare PPO | Admitting: Certified Registered Nurse Anesthetist

## 2020-08-02 ENCOUNTER — Other Ambulatory Visit: Payer: Self-pay

## 2020-08-02 ENCOUNTER — Observation Stay (HOSPITAL_COMMUNITY): Payer: Medicare PPO

## 2020-08-02 ENCOUNTER — Ambulatory Visit (HOSPITAL_COMMUNITY): Payer: Medicare PPO | Admitting: Physician Assistant

## 2020-08-02 ENCOUNTER — Observation Stay (HOSPITAL_COMMUNITY)
Admission: RE | Admit: 2020-08-02 | Discharge: 2020-08-03 | Disposition: A | Discharge status: Home / Self Care | Payer: Medicare PPO | Source: Ambulatory Visit | Attending: Urology | Admitting: Urology

## 2020-08-02 ENCOUNTER — Encounter (HOSPITAL_COMMUNITY)
Admission: RE | Disposition: A | Discharge status: Home / Self Care | Payer: Self-pay | Source: Ambulatory Visit | Attending: Urology

## 2020-08-02 ENCOUNTER — Encounter (HOSPITAL_COMMUNITY): Payer: Self-pay | Admitting: Urology

## 2020-08-02 DIAGNOSIS — Z7982 Long term (current) use of aspirin: Secondary | ICD-10-CM | POA: Diagnosis not present

## 2020-08-02 DIAGNOSIS — Z960 Presence of urogenital implants: Secondary | ICD-10-CM

## 2020-08-02 DIAGNOSIS — N270 Small kidney, unilateral: Secondary | ICD-10-CM | POA: Diagnosis not present

## 2020-08-02 DIAGNOSIS — Z86711 Personal history of pulmonary embolism: Secondary | ICD-10-CM | POA: Diagnosis not present

## 2020-08-02 DIAGNOSIS — J449 Chronic obstructive pulmonary disease, unspecified: Secondary | ICD-10-CM | POA: Diagnosis not present

## 2020-08-02 DIAGNOSIS — I10 Essential (primary) hypertension: Secondary | ICD-10-CM | POA: Insufficient documentation

## 2020-08-02 DIAGNOSIS — N135 Crossing vessel and stricture of ureter without hydronephrosis: Principal | ICD-10-CM | POA: Insufficient documentation

## 2020-08-02 DIAGNOSIS — N130 Hydronephrosis with ureteropelvic junction obstruction: Secondary | ICD-10-CM | POA: Diagnosis not present

## 2020-08-02 DIAGNOSIS — Z466 Encounter for fitting and adjustment of urinary device: Secondary | ICD-10-CM | POA: Diagnosis not present

## 2020-08-02 DIAGNOSIS — Z79899 Other long term (current) drug therapy: Secondary | ICD-10-CM | POA: Diagnosis not present

## 2020-08-02 DIAGNOSIS — K219 Gastro-esophageal reflux disease without esophagitis: Secondary | ICD-10-CM | POA: Diagnosis not present

## 2020-08-02 HISTORY — PX: ROBOT ASSISTED PYELOPLASTY: SHX5143

## 2020-08-02 LAB — TYPE AND SCREEN
ABO/RH(D): O POS
Antibody Screen: NEGATIVE

## 2020-08-02 LAB — HEMOGLOBIN AND HEMATOCRIT, BLOOD
HCT: 37.4 % — ABNORMAL LOW (ref 39.0–52.0)
Hemoglobin: 12 g/dL — ABNORMAL LOW (ref 13.0–17.0)

## 2020-08-02 LAB — ABO/RH: ABO/RH(D): O POS

## 2020-08-02 SURGERY — XI ROBOTIC ASSISTED PYELOPLASTY WITH STENT PLACEMENT
Anesthesia: General | Site: Abdomen | Laterality: Left

## 2020-08-02 MED ORDER — SUGAMMADEX SODIUM 200 MG/2ML IV SOLN
INTRAVENOUS | Status: DC | PRN
  Administered 2020-08-02: 200 mg via INTRAVENOUS

## 2020-08-02 MED ORDER — ATORVASTATIN CALCIUM 40 MG PO TABS
40.0000 mg | ORAL_TABLET | Freq: Every day | ORAL | Status: DC
  Administered 2020-08-02: 40 mg via ORAL
  Filled 2020-08-02: qty 1

## 2020-08-02 MED ORDER — FENTANYL CITRATE (PF) 100 MCG/2ML IJ SOLN
INTRAMUSCULAR | Status: AC
  Filled 2020-08-02: qty 2

## 2020-08-02 MED ORDER — ACETAMINOPHEN 10 MG/ML IV SOLN
1000.0000 mg | Freq: Four times a day (QID) | INTRAVENOUS | Status: AC
  Administered 2020-08-02 – 2020-08-03 (×4): 1000 mg via INTRAVENOUS
  Filled 2020-08-02 (×4): qty 100

## 2020-08-02 MED ORDER — ONDANSETRON HCL 4 MG/2ML IJ SOLN
INTRAMUSCULAR | Status: AC
  Filled 2020-08-02: qty 2

## 2020-08-02 MED ORDER — PANTOPRAZOLE SODIUM 40 MG PO TBEC
40.0000 mg | DELAYED_RELEASE_TABLET | Freq: Every day | ORAL | Status: DC
  Administered 2020-08-03: 40 mg via ORAL
  Filled 2020-08-02: qty 1

## 2020-08-02 MED ORDER — CHLORTHALIDONE 25 MG PO TABS
25.0000 mg | ORAL_TABLET | Freq: Every day | ORAL | Status: DC
  Administered 2020-08-03: 25 mg via ORAL
  Filled 2020-08-02: qty 1

## 2020-08-02 MED ORDER — DEXAMETHASONE SODIUM PHOSPHATE 10 MG/ML IJ SOLN
INTRAMUSCULAR | Status: AC
  Filled 2020-08-02: qty 1

## 2020-08-02 MED ORDER — KETAMINE HCL 10 MG/ML IJ SOLN
INTRAMUSCULAR | Status: AC
  Filled 2020-08-02: qty 1

## 2020-08-02 MED ORDER — BELLADONNA ALKALOIDS-OPIUM 16.2-60 MG RE SUPP
RECTAL | Status: AC
  Filled 2020-08-02: qty 1

## 2020-08-02 MED ORDER — CHLORHEXIDINE GLUCONATE 0.12 % MT SOLN
15.0000 mL | Freq: Once | OROMUCOSAL | Status: AC
  Administered 2020-08-02: 15 mL via OROMUCOSAL

## 2020-08-02 MED ORDER — ALBUTEROL SULFATE (2.5 MG/3ML) 0.083% IN NEBU: 2.5000 mg | INHALATION_SOLUTION | Freq: Four times a day (QID) | RESPIRATORY_TRACT | Status: DC | PRN

## 2020-08-02 MED ORDER — TRAMADOL HCL 50 MG PO TABS: 50.0000 mg | ORAL_TABLET | Freq: Four times a day (QID) | ORAL | 0 refills | Status: DC | PRN

## 2020-08-02 MED ORDER — PROPOFOL 10 MG/ML IV BOLUS
INTRAVENOUS | Status: AC
  Filled 2020-08-02: qty 20

## 2020-08-02 MED ORDER — DOCUSATE SODIUM 100 MG PO CAPS
100.0000 mg | ORAL_CAPSULE | Freq: Two times a day (BID) | ORAL | Status: DC
  Administered 2020-08-02 – 2020-08-03 (×2): 100 mg via ORAL
  Filled 2020-08-02 (×2): qty 1

## 2020-08-02 MED ORDER — BUPIVACAINE-EPINEPHRINE 0.5% -1:200000 IJ SOLN
INTRAMUSCULAR | Status: DC | PRN
  Administered 2020-08-02: 30 mL

## 2020-08-02 MED ORDER — CEFAZOLIN SODIUM-DEXTROSE 2-4 GM/100ML-% IV SOLN
2.0000 g | INTRAVENOUS | Status: AC
  Administered 2020-08-02: 2 g via INTRAVENOUS
  Filled 2020-08-02: qty 100

## 2020-08-02 MED ORDER — MIDAZOLAM HCL 5 MG/5ML IJ SOLN
INTRAMUSCULAR | Status: DC | PRN
  Administered 2020-08-02 (×2): 1 mg via INTRAVENOUS

## 2020-08-02 MED ORDER — BELLADONNA ALKALOIDS-OPIUM 16.2-60 MG RE SUPP: 1.0000 | Freq: Four times a day (QID) | RECTAL | Status: DC | PRN

## 2020-08-02 MED ORDER — METHYLENE BLUE 0.5 % INJ SOLN
INTRAVENOUS | Status: AC
  Filled 2020-08-02: qty 10

## 2020-08-02 MED ORDER — INDIGOTINDISULFONATE SODIUM 8 MG/ML IJ SOLN
INTRAMUSCULAR | Status: AC
  Filled 2020-08-02: qty 5

## 2020-08-02 MED ORDER — LIDOCAINE 2% (20 MG/ML) 5 ML SYRINGE
INTRAMUSCULAR | Status: DC | PRN
  Administered 2020-08-02: 80 mg via INTRAVENOUS

## 2020-08-02 MED ORDER — LIDOCAINE 2% (20 MG/ML) 5 ML SYRINGE
INTRAMUSCULAR | Status: AC
  Filled 2020-08-02: qty 5

## 2020-08-02 MED ORDER — PROPOFOL 10 MG/ML IV BOLUS
INTRAVENOUS | Status: DC | PRN
  Administered 2020-08-02: 150 mg via INTRAVENOUS

## 2020-08-02 MED ORDER — BUPIVACAINE LIPOSOME 1.3 % IJ SUSP
20.0000 mL | Freq: Once | INTRAMUSCULAR | Status: AC
  Administered 2020-08-02: 20 mL
  Filled 2020-08-02: qty 20

## 2020-08-02 MED ORDER — ONDANSETRON HCL 4 MG/2ML IJ SOLN: 4.0000 mg | INTRAMUSCULAR | Status: DC | PRN

## 2020-08-02 MED ORDER — MIRABEGRON ER 25 MG PO TB24
25.0000 mg | ORAL_TABLET | Freq: Every day | ORAL | Status: DC
  Administered 2020-08-02 – 2020-08-03 (×2): 25 mg via ORAL
  Filled 2020-08-02 (×2): qty 1

## 2020-08-02 MED ORDER — ALBUMIN HUMAN 5 % IV SOLN: INTRAVENOUS | Status: DC | PRN

## 2020-08-02 MED ORDER — BUPIVACAINE-EPINEPHRINE (PF) 0.5% -1:200000 IJ SOLN
INTRAMUSCULAR | Status: AC
  Filled 2020-08-02: qty 30

## 2020-08-02 MED ORDER — KETAMINE HCL 10 MG/ML IJ SOLN
INTRAMUSCULAR | Status: DC | PRN
  Administered 2020-08-02: 40 mg via INTRAVENOUS

## 2020-08-02 MED ORDER — HYDROMORPHONE HCL 1 MG/ML IJ SOLN
0.2500 mg | INTRAMUSCULAR | Status: DC | PRN
  Administered 2020-08-02 (×2): 0.5 mg via INTRAVENOUS

## 2020-08-02 MED ORDER — BELLADONNA ALKALOIDS-OPIUM 16.2-60 MG RE SUPP
1.0000 | Freq: Once | RECTAL | Status: AC
  Administered 2020-08-02: 1 via RECTAL

## 2020-08-02 MED ORDER — DEXAMETHASONE SODIUM PHOSPHATE 10 MG/ML IJ SOLN
INTRAMUSCULAR | Status: DC | PRN
  Administered 2020-08-02: 10 mg via INTRAVENOUS

## 2020-08-02 MED ORDER — PHENYLEPHRINE 40 MCG/ML (10ML) SYRINGE FOR IV PUSH (FOR BLOOD PRESSURE SUPPORT)
PREFILLED_SYRINGE | INTRAVENOUS | Status: DC | PRN
  Administered 2020-08-02: 80 ug via INTRAVENOUS

## 2020-08-02 MED ORDER — HYDROMORPHONE HCL 1 MG/ML IJ SOLN
INTRAMUSCULAR | Status: AC
Start: 2020-08-02 — End: 2020-08-03
  Filled 2020-08-02: qty 1

## 2020-08-02 MED ORDER — LACTATED RINGERS IV SOLN: INTRAVENOUS | Status: DC

## 2020-08-02 MED ORDER — BACITRACIN-NEOMYCIN-POLYMYXIN 400-5-5000 EX OINT: 1.0000 "application " | TOPICAL_OINTMENT | Freq: Three times a day (TID) | CUTANEOUS | Status: DC | PRN

## 2020-08-02 MED ORDER — MIDAZOLAM HCL 2 MG/2ML IJ SOLN
INTRAMUSCULAR | Status: AC
  Filled 2020-08-02: qty 2

## 2020-08-02 MED ORDER — TRAMADOL HCL 50 MG PO TABS
50.0000 mg | ORAL_TABLET | Freq: Four times a day (QID) | ORAL | Status: DC | PRN
  Administered 2020-08-03: 50 mg via ORAL
  Filled 2020-08-02: qty 1

## 2020-08-02 MED ORDER — LABETALOL HCL 5 MG/ML IV SOLN
5.0000 mg | Freq: Once | INTRAVENOUS | Status: AC
  Administered 2020-08-02: 5 mg via INTRAVENOUS

## 2020-08-02 MED ORDER — HYDROMORPHONE HCL 1 MG/ML IJ SOLN
0.5000 mg | INTRAMUSCULAR | Status: DC | PRN
  Administered 2020-08-02: 1 mg via INTRAVENOUS
  Filled 2020-08-02: qty 1

## 2020-08-02 MED ORDER — TAMSULOSIN HCL 0.4 MG PO CAPS
0.4000 mg | ORAL_CAPSULE | Freq: Every day | ORAL | Status: DC
  Administered 2020-08-02: 0.4 mg via ORAL
  Filled 2020-08-02: qty 1

## 2020-08-02 MED ORDER — ROCURONIUM BROMIDE 10 MG/ML (PF) SYRINGE
PREFILLED_SYRINGE | INTRAVENOUS | Status: AC
  Filled 2020-08-02: qty 10

## 2020-08-02 MED ORDER — LABETALOL HCL 5 MG/ML IV SOLN
INTRAVENOUS | Status: AC
  Filled 2020-08-02: qty 4

## 2020-08-02 MED ORDER — DIPHENHYDRAMINE HCL 50 MG/ML IJ SOLN: 12.5000 mg | Freq: Four times a day (QID) | INTRAMUSCULAR | Status: DC | PRN

## 2020-08-02 MED ORDER — DEXTROSE-NACL 5-0.45 % IV SOLN: INTRAVENOUS | Status: DC

## 2020-08-02 MED ORDER — METHYLENE BLUE 0.5 % INJ SOLN
INTRAVENOUS | Status: DC | PRN
  Administered 2020-08-02: 5 mL via SUBMUCOSAL

## 2020-08-02 MED ORDER — WATER FOR IRRIGATION, STERILE IR SOLN
Status: DC | PRN
  Administered 2020-08-02: 1000 mL
  Administered 2020-08-02: 3000 mL via INTRAVESICAL

## 2020-08-02 MED ORDER — DIPHENHYDRAMINE HCL 12.5 MG/5ML PO ELIX: 12.5000 mg | ORAL_SOLUTION | Freq: Four times a day (QID) | ORAL | Status: DC | PRN

## 2020-08-02 MED ORDER — LACTATED RINGERS IR SOLN
Status: DC | PRN
  Administered 2020-08-02: 1000 mL

## 2020-08-02 MED ORDER — ORAL CARE MOUTH RINSE: 15.0000 mL | Freq: Once | OROMUCOSAL | Status: AC

## 2020-08-02 MED ORDER — CHLORHEXIDINE GLUCONATE CLOTH 2 % EX PADS
6.0000 | MEDICATED_PAD | Freq: Every day | CUTANEOUS | Status: DC
  Administered 2020-08-02 – 2020-08-03 (×2): 6 via TOPICAL

## 2020-08-02 MED ORDER — ALBUMIN HUMAN 5 % IV SOLN
INTRAVENOUS | Status: AC
  Filled 2020-08-02: qty 250

## 2020-08-02 MED ORDER — ACETAMINOPHEN 325 MG PO TABS: 650.0000 mg | ORAL_TABLET | ORAL | Status: DC | PRN

## 2020-08-02 MED ORDER — SODIUM CHLORIDE (PF) 0.9 % IJ SOLN
INTRAMUSCULAR | Status: AC
  Filled 2020-08-02: qty 50

## 2020-08-02 MED ORDER — AMLODIPINE BESYLATE 5 MG PO TABS
5.0000 mg | ORAL_TABLET | Freq: Every day | ORAL | Status: DC
  Administered 2020-08-02 – 2020-08-03 (×2): 5 mg via ORAL
  Filled 2020-08-02 (×2): qty 1

## 2020-08-02 MED ORDER — EPHEDRINE SULFATE-NACL 50-0.9 MG/10ML-% IV SOSY
PREFILLED_SYRINGE | INTRAVENOUS | Status: DC | PRN
  Administered 2020-08-02 (×2): 10 mg via INTRAVENOUS

## 2020-08-02 MED ORDER — ONDANSETRON HCL 4 MG/2ML IJ SOLN: 4.0000 mg | Freq: Once | INTRAMUSCULAR | Status: DC | PRN

## 2020-08-02 MED ORDER — ROCURONIUM BROMIDE 10 MG/ML (PF) SYRINGE
PREFILLED_SYRINGE | INTRAVENOUS | Status: DC | PRN
  Administered 2020-08-02: 10 mg via INTRAVENOUS
  Administered 2020-08-02: 20 mg via INTRAVENOUS
  Administered 2020-08-02: 60 mg via INTRAVENOUS

## 2020-08-02 SURGICAL SUPPLY — 55 items
CATH FOLEY 3WAY  5CC 16FR (CATHETERS) ×2
CATH FOLEY 3WAY 5CC 16FR (CATHETERS) ×1 IMPLANT
CHLORAPREP W/TINT 26 (MISCELLANEOUS) ×2 IMPLANT
CLIP VESOLOCK LG 6/CT PURPLE (CLIP) ×2 IMPLANT
CLIP VESOLOCK MED LG 6/CT (CLIP) ×2 IMPLANT
COVER SURGICAL LIGHT HANDLE (MISCELLANEOUS) ×2 IMPLANT
COVER TIP SHEARS 8 DVNC (MISCELLANEOUS) ×1 IMPLANT
COVER TIP SHEARS 8MM DA VINCI (MISCELLANEOUS) ×2
COVER WAND RF STERILE (DRAPES) IMPLANT
DECANTER SPIKE VIAL GLASS SM (MISCELLANEOUS) ×2 IMPLANT
DRAIN CHANNEL 15F RND FF 3/16 (WOUND CARE) ×2 IMPLANT
DRAPE ARM DVNC X/XI (DISPOSABLE) ×4 IMPLANT
DRAPE COLUMN DVNC XI (DISPOSABLE) ×1 IMPLANT
DRAPE DA VINCI XI ARM (DISPOSABLE) ×8
DRAPE DA VINCI XI COLUMN (DISPOSABLE) ×2
DRAPE INCISE IOBAN 66X45 STRL (DRAPES) ×2 IMPLANT
DRAPE SHEET LG 3/4 BI-LAMINATE (DRAPES) ×2 IMPLANT
ELECT REM PT RETURN 15FT ADLT (MISCELLANEOUS) ×2 IMPLANT
EVACUATOR SILICONE 100CC (DRAIN) ×2 IMPLANT
GLOVE BIO SURGEON STRL SZ 6.5 (GLOVE) ×2 IMPLANT
GLOVE BIOGEL M STRL SZ7.5 (GLOVE) ×6 IMPLANT
GOWN STRL REUS W/TWL LRG LVL3 (GOWN DISPOSABLE) ×4 IMPLANT
GUIDEWIRE STR DUAL SENSOR (WIRE) ×2 IMPLANT
IRRIG SUCT STRYKERFLOW 2 WTIP (MISCELLANEOUS)
IRRIGATION SUCT STRKRFLW 2 WTP (MISCELLANEOUS) IMPLANT
KIT BASIN OR (CUSTOM PROCEDURE TRAY) ×2 IMPLANT
KIT TURNOVER KIT A (KITS) IMPLANT
LOOP VESSEL MAXI BLUE (MISCELLANEOUS) ×2 IMPLANT
NEEDLE INSUFFLATION 14GA 120MM (NEEDLE) ×2 IMPLANT
NS IRRIG 1000ML POUR BTL (IV SOLUTION) ×2 IMPLANT
PAD POSITIONING PINK XL (MISCELLANEOUS) ×2 IMPLANT
PENCIL SMOKE EVACUATOR (MISCELLANEOUS) IMPLANT
PROTECTOR NERVE ULNAR (MISCELLANEOUS) ×4 IMPLANT
SEAL CANN UNIV 5-8 DVNC XI (MISCELLANEOUS) ×4 IMPLANT
SEAL XI 5MM-8MM UNIVERSAL (MISCELLANEOUS) ×8
SET IRRIG Y TYPE TUR BLADDER L (SET/KITS/TRAYS/PACK) ×2 IMPLANT
SET TUBE SMOKE EVAC HIGH FLOW (TUBING) IMPLANT
SOLUTION ELECTROLUBE (MISCELLANEOUS) ×2 IMPLANT
SPONGE LAP 4X18 RFD (DISPOSABLE) ×2 IMPLANT
STENT URET 6FRX28 CONTOUR (STENTS) ×2 IMPLANT
SUT ETHILON 3 0 PS 1 (SUTURE) ×2 IMPLANT
SUT MNCRL AB 4-0 PS2 18 (SUTURE) ×4 IMPLANT
SUT MON AB 4-0 SH 27 (SUTURE) ×4 IMPLANT
SUT VIC AB 0 CT1 27 (SUTURE)
SUT VIC AB 0 CT1 27XBRD ANTBC (SUTURE) IMPLANT
SUT VIC AB 0 UR5 27 (SUTURE) IMPLANT
SUT VIC AB 4-0 RB1 27 (SUTURE) ×6
SUT VIC AB 4-0 RB1 27XBRD (SUTURE) ×3 IMPLANT
SUT VICRYL 0 UR6 27IN ABS (SUTURE) ×2 IMPLANT
TOWEL OR NON WOVEN STRL DISP B (DISPOSABLE) ×2 IMPLANT
TRAY FOLEY MTR SLVR 16FR STAT (SET/KITS/TRAYS/PACK) ×2 IMPLANT
TRAY LAPAROSCOPIC (CUSTOM PROCEDURE TRAY) ×2 IMPLANT
TROCAR BLADELESS OPT 5 100 (ENDOMECHANICALS) ×2 IMPLANT
TROCAR XCEL 12X100 BLDLESS (ENDOMECHANICALS) IMPLANT
WATER STERILE IRR 1000ML POUR (IV SOLUTION) ×2 IMPLANT

## 2020-08-02 NOTE — H&P (Signed)
79 year old male who is seen today for and left UPJ obstruction. The patient was noted to have a left hydronephrosis this spring when he was being evaluated for low back pain. 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 Dr. Diona Fanti placed a stent in May after undergoing ureteroscopy and finding no significant findings other than a UPJ obstruction. He subsequently had a Lasix renogram which demonstrated 35% function of his left kidney. The patient's baseline renal function is diminished with a creatinine between 2.5 and 3.0. The patient has chronic hypertension likely the etiology of his renal insufficiency. He also has had kidney stones in the past and may have developed a scar from ureteroscopy at some point in his history.   The patient is otherwise relatively healthy. He had a heart catheterization 20 years ago which was normal. He denies a history diabetes or heart attack. He has no trouble walking a flight of steps or City block. He is fairly active. He does continue to have low back pain, but is eager to get his kidney fixed 1st.    The patient is complaining of symptoms consistent with prostatitis in that he has intermittent dysuria and worsening frequency and urgency. This is been present since before the stent was placed. He does not take tamsulosin or anything for his prostate.     ALLERGIES: None   MEDICATIONS: Aspirin 81 mg tablet,chewable  Nexium 20 mg capsule,delayed release  Sildenafil Citrate 20 mg tablet 2 tablet PO prn  Tamsulosin Hcl 0.4 mg capsule  Amlodipine Besylate 5 mg tablet  Atorvastatin Calcium 40 mg tablet  Chlorthalidone 25 mg tablet  Chlorthalidone 25 mg tablet  Prednisone 10 mg tablet, dose pack  Senna 8.6 mg tablet  Tylenol Extra Strength 500 mg tablet  Zzzquil 50 mg/30 ml liquid     GU PSH: Cystoscopy Insert Stent, Left - 03/25/2020 Cystoscopy Ureteroscopy, Left - 03/25/2020       PSH Notes: thumb   NON-GU PSH: Anesth,  Catheterize Heart Appendectomy     GU PMH: Dysuria, UA today indicates possible infection though appearance of urine could just be secondary to stent. Will culture. Poss UTI though he may also just be having some bladder spasms. 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 Symptoms are moderate, bothersome enough to want to be on a medication. He has been on medical therapy before. I don't think he has any real obstruction issues. - 2018    NON-GU PMH: Anxiety COPD Depression GERD Hypercholesterolemia Hypertension Pulmonary Embolism, History Sleep Apnea    FAMILY HISTORY: Brain tumor - Mother Heart Disease - Sister Hematuria - Runs in Family Hypertension - Father Kidney Cancer - Mother leukemia - Mother Prostate Cancer - Brother Seizure - Sister   SOCIAL HISTORY: Marital Status: Married Preferred Language: English; Ethnicity: Not Hispanic Or Latino; Race: Black or African American Current Smoking Status: Patient has never smoked.   Tobacco Use Assessment Completed: Used Tobacco in last 30 days? Does not use smokeless tobacco. Has never drank.  Does not use drugs. Drinks 1 caffeinated drink per day. Patient's occupation is/was retired.     Notes: 2 daughters   REVIEW OF SYSTEMS:    GU Review Male:   Patient reports frequent urination, hard to postpone urination, burning/ pain with urination,  and get up at night to urinate. Patient denies leakage of urine, stream starts and stops, trouble starting your stream, have to strain to urinate , erection problems, and penile pain.  Gastrointestinal (Upper):   Patient denies nausea, vomiting, and indigestion/ heartburn.  Gastrointestinal (Lower):   Patient denies  diarrhea and constipation.  Constitutional:   Patient denies fatigue, weight loss, fever, and night sweats.  Skin:   Patient denies skin rash/ lesion and itching.  Eyes:   Patient denies blurred vision and double vision.  Ears/ Nose/ Throat:   Patient denies sore throat and sinus problems.  Hematologic/Lymphatic:   Patient denies swollen glands and easy bruising.  Cardiovascular:   Patient denies leg swelling and chest pains.  Respiratory:   Patient denies cough and shortness of breath.  Endocrine:   Patient denies excessive thirst.  Musculoskeletal:   Patient denies back pain and joint pain.  Neurological:   Patient denies headaches and dizziness.  Psychologic:   Patient denies depression and anxiety.   VITAL SIGNS:      06/24/2020 12:02 PM  Weight 204 lb / 92.53 kg  BP 130/70 mmHg  Pulse 57 /min   MULTI-SYSTEM PHYSICAL EXAMINATION:    Constitutional: Well-nourished. No physical deformities. Normally developed. Good grooming.  Respiratory: Normal breath sounds. No labored breathing, no use of accessory muscles.   Cardiovascular: Regular rate and rhythm. No murmur, no gallop. Normal temperature, normal extremity pulses, no swelling, no varicosities.      Complexity of Data:  Lab Test Review:   BMP  Records Review:   Previous Doctor Records, Previous Hospital Records, Previous Patient Records  Urine Test Review:   Urinalysis  X-Ray Review: C.T. Abdomen/Pelvis: Reviewed Films. Discussed With Patient.  Lasix Renogram: Reviewed Films. Discussed With Patient.     PROCEDURES:          Urinalysis w/Scope Dipstick Dipstick Cont'd Micro  Color: Yellow Bilirubin: Neg mg/dL WBC/hpf: 6 - 10/hpf  Appearance: Slightly Cloudy Ketones: Neg mg/dL RBC/hpf: 20 - 40/hpf  Specific Gravity: 1.020 Blood: 3+ ery/uL Bacteria: NS (Not Seen)  pH: 5.5 Protein: 1+ mg/dL Cystals: NS (Not Seen)  Glucose: Neg mg/dL Urobilinogen: 0.2 mg/dL Casts: NS (Not Seen)    Nitrites: Neg Trichomonas: Not Present     Leukocyte Esterase: 1+ leu/uL Mucous: Not Present      Epithelial Cells: NS (Not Seen)      Yeast: NS (Not Seen)      Sperm: Not Present    ASSESSMENT:      ICD-10 Details  1 GU:   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 He has been stented and has been tolerating the stent reasonably well. He is complaining of some symptoms consistent with prostatitis, but seems to be unrelated to his stent. In addition, he is having ongoing back pain suggesting that his pain is not related to his  hydronephrosis but more from his low back or spinal stenosis.   I discussed the treatment options with this patient regards to management of his UPJ obstruction. We discussed pulling the stent and letting the left kidney atrophy. We also discussed serial stent exchanges every 6 months for the indefinite future and finally a robotic assisted left pyeloplasty. Having gone through all the different treatment options the patient is opted to proceed with surgery.   I reviewed the surgery of a robotic assisted left pyeloplasty with the patient in great detail. The patient understands the nature of the procedure as well as the risks and the benefits. He understands that he will be hospitalized for the surgery at least 1 night. He also understands that he will have a stent that will need to be removed several weeks after the operation. Having gone through all the risks and the benefits with the patient and his significant other he opted to proceed. Will try to get this scheduled for the patient as soon as possible.

## 2020-08-02 NOTE — Discharge Instructions (Signed)

## 2020-08-02 NOTE — Op Note (Signed)
Preoperative diagnosis: left ureteropelvic junction obstruction  Postoperative diagnosis: left ureteropelvic junction obstruction  Procedure:  1. left ureteral stent placement  2. left robotic-assisted laparoscopic dismembered pyeloplasty  Surgeon: Louis Meckel,  M.D.  Assistant(s): Debbrah Alar, PA-C Resident Surgeon: Celene Squibb, MD  Anesthesia: General  Complications: None  EBL: 25 mL   Specimens: 1. none  Intraoperative findings: small crossing vessel at the renal hilum, thick rind surrounding renal pelvis, anastomosis was on some tension.  Drains:  1. # 15 Blake perinephric drain 2. 16 Fr Foley catheter  Indication: Joshua Farmer is a 79 y.o. patient with a suspected left ureteropelvic junction obstruction.  After a thorough review of the management options for their ureteropelvic junction obstruction, they elected to proceed with surgical treatment and the above procedure.  We have discussed the potential benefits and risks of the procedure, side effects of the proposed treatment, the likelihood of the patient achieving the goals of the procedure, and any potential problems that might occur during the procedure or recuperation. Informed consent has been obtained.  Description of procedure: The patient was taken to the operating room and a general anesthetic was administered. The patient was given preoperative antibiotics, placed in left modified flank position and prepped and draped in the usual sterile fashion. Next a preoperative timeout was performed.   An assistant was required for this surgical procedure.  The duties of the assistant included but were not limited to suctioning, passing suture, camera manipulation, retraction. This procedure would not be able to be performed without an Environmental consultant.   A site was selected on the left side of the umbilicus for placement of the camera port. This was placed using a standard open Hassan technique which allowed entry  into the peritoneal cavity under direct vision and without difficulty. A 8 mm port was placed and a pneumoperitoneum established. The camera was then used to inspect the abdomen and there was no evidence of any intra-abdominal injuries or other abnormalities. The remaining abdominal ports were then placed. 8 mm robotic ports were placed in the ipsilateral upper quadrant, lower quadrant, and far lateral abdominal wall. A 12 mm port was placed in the upper midline for laparoscopic assistance. All ports were placed under direct vision without difficulty. The surgical cart was then docked.   Utilizing the cautery scissors, the white line of Toldt was incised allowing the plane between the mesocolon and the anterior layer of Gerota's fascia to be developed and the kidney exposed.  The ureter and gonadal vein were identified inferiorly and the ureter was lifted anteriorly off the psoas muscle.  Dissection proceeded superiorly along the gonadal vein until the main renal hilum and renal pelvis was identified.  The ureteropelvic junction was isolated from the surrounding structures with a combination of sharp and blunt dissection.    The ureteropelvic junction was divided. The renal pelvis incision was extended inferiorly to allow an improved insertion for the ureter.  The ureter and renal pelvis were then examined.  Any excess renal pelvis was excised as needed and the renal pelvis and ureter were then spatulated appropriately.  4-0 vicryl sutures were then used to reapproximate the renal pelvis and ureter with running sutures along the anterior and posterior wall.  With the anterior wall sutured and the start of the posterior wall we placed our stent. A sensor wire was passed down the ureter and then a 6x28 JJ stent was passed over the wire. At this point we filled the bladder with methylene blue  and saline to 350 ml's and had good refulx of fluid into the renal pelvis with a good curl of the stent in the renal  pelvis confirming location in the bladder. We then completed the posterior wall. The anastomosis was performed in a tension-free, watertight fashion with the ureter reconnected to the renal pelvis in a position to provide dependent drainage of the renal collecting system.   A # 15 Blake perinephric drain was then brought through the lateral lower abdominal port site and positioned appropriately.  It was secured to the skin with a nylon suture.  The 12 mm port sites were then closed with 0-vicryl sutures placed laparoscopically with the laparoscopic suture passer. All remaining ports were removed under direct vision after hemostasis was confirmed with the pneumoperiotneum let down. All port sites were injected with 0.25% bupivicaine and reapproximated at the skin level with 4-0 monocryl subcuticular sutures. Dermabond was applied to the skin.  The patient appeared to tolerate the procedure well and without complications.  The patient was able to be extubated and transferred to the recovery unit in satisfactory condition.

## 2020-08-02 NOTE — Anesthesia Postprocedure Evaluation (Signed)
Anesthesia Post Note  Patient: Joshua Farmer  Procedure(s) Performed: XI ROBOTIC ASSISTED LEFT PYELOPLASTY WITH STENT EXCHANGE (Left Abdomen)     Patient location during evaluation: PACU Anesthesia Type: General Level of consciousness: awake and alert and oriented Pain management: pain level controlled Vital Signs Assessment: post-procedure vital signs reviewed and stable Respiratory status: spontaneous breathing, nonlabored ventilation and respiratory function stable Cardiovascular status: blood pressure returned to baseline and stable Postop Assessment: no apparent nausea or vomiting Anesthetic complications: no   No complications documented.  Last Vitals:  Vitals:   08/02/20 1330 08/02/20 1345  BP: (!) 152/95 (!) 147/77  Pulse: 67 (!) 50  Resp:    Temp: (!) 36.3 C   SpO2: 98% 97%    Last Pain:  Vitals:   08/02/20 1330  TempSrc:   PainSc: Asleep                 Avamae Dehaan A.

## 2020-08-02 NOTE — Anesthesia Procedure Notes (Signed)
Procedure Name: Intubation Date/Time: 08/02/2020 7:35 AM Performed by: Gerald Leitz, CRNA Pre-anesthesia Checklist: Patient identified, Patient being monitored, Timeout performed, Emergency Drugs available and Suction available Patient Re-evaluated:Patient Re-evaluated prior to induction Oxygen Delivery Method: Circle system utilized Preoxygenation: Pre-oxygenation with 100% oxygen Induction Type: IV induction Ventilation: Mask ventilation without difficulty Laryngoscope Size: Mac and 3 Grade View: Grade I Tube type: Oral Tube size: 7.5 mm Number of attempts: 1 Placement Confirmation: ETT inserted through vocal cords under direct vision,  positive ETCO2 and breath sounds checked- equal and bilateral Secured at: 21 cm Tube secured with: Tape Dental Injury: Teeth and Oropharynx as per pre-operative assessment

## 2020-08-02 NOTE — Interval H&P Note (Signed)
History and Physical Interval Note:  08/02/2020 7:21 AM  Joshua Farmer  has presented today for surgery, with the diagnosis of LEFT URETERAL PELVIC JUNCTION OBSTRUCTION.  The various methods of treatment have been discussed with the patient and family. After consideration of risks, benefits and other options for treatment, the patient has consented to  Procedure(s): XI ROBOTIC ASSISTED LEFT PYELOPLASTY WITH STENT EXCHANGE (Left) as a surgical intervention.  The patient's history has been reviewed, patient examined, no change in status, stable for surgery.  I have reviewed the patient's chart and labs.  Questions were answered to the patient's satisfaction.     Ardis Hughs

## 2020-08-02 NOTE — Progress Notes (Signed)
Received patient from PACU, VS obtained, foley and JP drain intact, oriented to unit, call light placed in reach

## 2020-08-02 NOTE — Plan of Care (Signed)

## 2020-08-02 NOTE — Transfer of Care (Signed)
Immediate Anesthesia Transfer of Care Note  Patient: Joshua Farmer  Procedure(s) Performed: Procedure(s): XI ROBOTIC ASSISTED LEFT PYELOPLASTY WITH STENT EXCHANGE (Left)  Patient Location: PACU  Anesthesia Type:General  Level of Consciousness: Alert, Awake, Oriented  Airway & Oxygen Therapy: Patient Spontanous Breathing  Post-op Assessment: Report given to RN  Post vital signs: Reviewed and stable  Last Vitals:  Vitals:   08/02/20 0605 08/02/20 1214  BP: 140/69   Pulse: (!) 46 81  Resp: 16 (!) 22  Temp: 36.4 C   SpO2: 094% 709%    Complications: No apparent anesthesia complications

## 2020-08-03 ENCOUNTER — Encounter (HOSPITAL_COMMUNITY): Payer: Self-pay | Admitting: Urology

## 2020-08-03 DIAGNOSIS — Z86711 Personal history of pulmonary embolism: Secondary | ICD-10-CM | POA: Diagnosis not present

## 2020-08-03 DIAGNOSIS — N135 Crossing vessel and stricture of ureter without hydronephrosis: Secondary | ICD-10-CM | POA: Diagnosis not present

## 2020-08-03 DIAGNOSIS — J449 Chronic obstructive pulmonary disease, unspecified: Secondary | ICD-10-CM | POA: Diagnosis not present

## 2020-08-03 DIAGNOSIS — I10 Essential (primary) hypertension: Secondary | ICD-10-CM | POA: Diagnosis not present

## 2020-08-03 DIAGNOSIS — Z7982 Long term (current) use of aspirin: Secondary | ICD-10-CM | POA: Diagnosis not present

## 2020-08-03 DIAGNOSIS — Z79899 Other long term (current) drug therapy: Secondary | ICD-10-CM | POA: Diagnosis not present

## 2020-08-03 DIAGNOSIS — N270 Small kidney, unilateral: Secondary | ICD-10-CM | POA: Diagnosis not present

## 2020-08-03 LAB — BASIC METABOLIC PANEL
Anion gap: 7 (ref 5–15)
BUN: 32 mg/dL — ABNORMAL HIGH (ref 8–23)
CO2: 24 mmol/L (ref 22–32)
Calcium: 8.9 mg/dL (ref 8.9–10.3)
Chloride: 108 mmol/L (ref 98–111)
Creatinine, Ser: 2.22 mg/dL — ABNORMAL HIGH (ref 0.61–1.24)
GFR calc Af Amer: 32 mL/min — ABNORMAL LOW (ref >60)
GFR calc non Af Amer: 28 mL/min — ABNORMAL LOW (ref >60)
Glucose, Bld: 129 mg/dL — ABNORMAL HIGH (ref 70–99)
Potassium: 3.8 mmol/L (ref 3.5–5.1)
Sodium: 139 mmol/L (ref 135–145)

## 2020-08-03 LAB — HEMOGLOBIN AND HEMATOCRIT, BLOOD
HCT: 32.2 % — ABNORMAL LOW (ref 39.0–52.0)
Hemoglobin: 10.3 g/dL — ABNORMAL LOW (ref 13.0–17.0)

## 2020-08-03 NOTE — Progress Notes (Signed)
AVS given to patient and explained at the bedside. Medications and follow up appointments have been explained with pt verbalizing understanding.  

## 2020-08-03 NOTE — Progress Notes (Signed)
Foley catheter discontinued per MD orders with 13ml urine noted in drainage bag.  Patient tolerated well and educated on using urinal and saving for measurement and color check.

## 2020-08-03 NOTE — Progress Notes (Signed)
Urology Inpatient Progress Report  UPJ (ureteropelvic junction) obstruction [N13.5]  Procedure(s): XI ROBOTIC ASSISTED LEFT PYELOPLASTY WITH STENT EXCHANGE  1 Day Post-Op   Intv/Subj: No acute events overnight.  Patient has ambulated x2 last night.  Tolerating clears. Foley removed this AM and has not yet voided.   Active Problems:   UPJ (ureteropelvic junction) obstruction  Current Facility-Administered Medications  Medication Dose Route Frequency Provider Last Rate Last Admin  . acetaminophen (OFIRMEV) IV 1,000 mg  1,000 mg Intravenous Q6H Ardis Hughs, MD 400 mL/hr at 08/03/20 0534 1,000 mg at 08/03/20 0534  . albuterol (PROVENTIL) (2.5 MG/3ML) 0.083% nebulizer solution 2.5 mg  2.5 mg Nebulization Q6H PRN Dancy, Amanda, PA-C      . amLODipine (NORVASC) tablet 5 mg  5 mg Oral Daily Dancy, Amanda, PA-C   5 mg at 08/02/20 1550  . atorvastatin (LIPITOR) tablet 40 mg  40 mg Oral QHS Debbrah Alar, PA-C   40 mg at 08/02/20 2042  . Chlorhexidine Gluconate Cloth 2 % PADS 6 each  6 each Topical Daily Ardis Hughs, MD   6 each at 08/02/20 2251  . chlorthalidone (HYGROTON) tablet 25 mg  25 mg Oral Daily Dancy, Amanda, PA-C      . dextrose 5 %-0.45 % sodium chloride infusion   Intravenous Continuous Debbrah Alar, PA-C 100 mL/hr at 08/03/20 0101 New Bag at 08/03/20 0101  . diphenhydrAMINE (BENADRYL) injection 12.5 mg  12.5 mg Intravenous Q6H PRN Dancy, Amanda, PA-C       Or  . diphenhydrAMINE (BENADRYL) 12.5 MG/5ML elixir 12.5 mg  12.5 mg Oral Q6H PRN Dancy, Amanda, PA-C      . docusate sodium (COLACE) capsule 100 mg  100 mg Oral BID Debbrah Alar, PA-C   100 mg at 08/02/20 2042  . HYDROmorphone (DILAUDID) injection 0.5-1 mg  0.5-1 mg Intravenous Q2H PRN Debbrah Alar, PA-C   1 mg at 08/02/20 1452  . mirabegron ER (MYRBETRIQ) tablet 25 mg  25 mg Oral Daily Debbrah Alar, PA-C   25 mg at 08/02/20 1550  . neomycin-bacitracin-polymyxin (NEOSPORIN) ointment packet 1 application  1  application Topical TID PRN Dancy, Amanda, PA-C      . ondansetron (ZOFRAN) injection 4 mg  4 mg Intravenous Q4H PRN Dancy, Amanda, PA-C      . opium-belladonna (B&O) suppository 16.2-60mg   1 suppository Rectal Q6H PRN Dancy, Amanda, PA-C      . pantoprazole (PROTONIX) EC tablet 40 mg  40 mg Oral Daily Dancy, Amanda, PA-C      . tamsulosin (FLOMAX) capsule 0.4 mg  0.4 mg Oral QHS Debbrah Alar, PA-C   0.4 mg at 08/02/20 2042  . traMADol (ULTRAM) tablet 50-100 mg  50-100 mg Oral Q6H PRN Debbrah Alar, PA-C         Objective: Vital: Vitals:   08/02/20 1839 08/02/20 2221 08/03/20 0205 08/03/20 0548  BP: (!) 148/76 139/77 120/71 119/63  Pulse: 71 68 (!) 54 (!) 51  Resp: 13 20 18 20   Temp: (!) 97.3 F (36.3 C) (!) 97.5 F (36.4 C) 98.2 F (36.8 C) 98.4 F (36.9 C)  TempSrc:  Oral Oral Oral  SpO2: 98% 97% 100% 99%  Weight:      Height:       I/Os: I/O last 3 completed shifts: In: 4053.3 [P.O.:120; I.V.:3283.3; IV Piggyback:650] Out: 2094 [Urine:1525; Drains:80; Blood:10]  Physical Exam:  General: Patient is in no apparent distress Lungs: Normal respiratory effort, chest expands symmetrically. GI: Incisions are c/d/i.  The abdomen is soft and appropriately ttp. JP drain with serosanguinous drainage Foley: removed Ext: lower extremities symmetric  Lab Results: Recent Labs    08/02/20 1243 08/03/20 0438  HGB 12.0* 10.3*  HCT 37.4* 32.2*   Recent Labs    08/03/20 0438  NA 139  K 3.8  CL 108  CO2 24  GLUCOSE 129*  BUN 32*  CREATININE 2.22*  CALCIUM 8.9   No results for input(s): LABPT, INR in the last 72 hours. No results for input(s): LABURIN in the last 72 hours. Results for orders placed or performed during the hospital encounter of 07/30/20  SARS CORONAVIRUS 2 (TAT 6-24 HRS) Nasopharyngeal Nasopharyngeal Swab     Status: None   Collection Time: 07/30/20  9:26 AM   Specimen: Nasopharyngeal Swab  Result Value Ref Range Status   SARS Coronavirus 2 NEGATIVE  NEGATIVE Final    Comment: (NOTE) SARS-CoV-2 target nucleic acids are NOT DETECTED.  The SARS-CoV-2 RNA is generally detectable in upper and lower respiratory specimens during the acute phase of infection. Negative results do not preclude SARS-CoV-2 infection, do not rule out co-infections with other pathogens, and should not be used as the sole basis for treatment or other patient management decisions. Negative results must be combined with clinical observations, patient history, and epidemiological information. The expected result is Negative.  Fact Sheet for Patients: SugarRoll.be  Fact Sheet for Healthcare Providers: https://www.woods-mathews.com/  This test is not yet approved or cleared by the [place] FDA and  has been authorized for detection and/or diagnosis of SARS-CoV-2 by FDA under an Emergency Use Authorization (EUA). This EUA will remain  in effect (meaning this test can be used) for the duration of the COVID-19 declaration under Se ction 564(b)(1) of the Act, 21 U.S.C. section 360bbb-3(b)(1), unless the authorization is terminated or revoked sooner.  Performed at [facility], [place] [address] [place], [place] [address]     Studies/Results: DG Abd 1 View  Result Date: 08/02/2020 CLINICAL DATA:  Ureteral stent placement EXAM: ABDOMEN - 1 VIEW COMPARISON:  CT abdomen and pelvis February 08, 2020 FINDINGS: A double-J stent has been placed on the left from the level of L1 to the level of the bladder. There is an apparent drain lateral to the double-J stent. There is an ill-defined calcification lateral to the drain on the left measuring 1.3 x 1.3 cm. Exact location of this calcification is uncertain. There is no bowel dilatation or air-fluid level to suggest bowel obstruction. No free air evident on supine examination. IMPRESSION: Double-J stent on the left extends from L1 the bladder. Apparent drain lateral to the stent on  the left. Ill-defined 1.3 cm calcification noted lateral to the stent on the left, uncertain location. No bowel obstruction or free air evident. Electronically Signed   By: Lowella Grip III M.D.   On: 08/02/2020 12:58    Assessment: 79 year old man with left UPJ obstruction postop day 1 status post a left robotic assisted laparoscopic pyeloplasty doing well.    Plan: -Continue to ambulate -Foley has been removed this morning and will follow up to see if patient voids -Follow JP drain output after Foley's been removed -Advance diet as tolerated -SCDs at all times while in bed -Anticipated discharge later today   Jacalyn Lefevre, MD Urology 08/03/2020, 7:02 AM

## 2020-08-03 NOTE — Discharge Summary (Signed)
Date of admission: 08/02/2020  Date of discharge: 08/03/2020  Admission diagnosis: Left UPJ obstruction  Discharge diagnosis: Left UPJ obstruction  Secondary diagnoses:  Patient Active Problem List   Diagnosis Date Noted  . 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 Hypertension   . High cholesterol   . Gastroenteritis     Procedures performed: Procedure(s): XI ROBOTIC ASSISTED LEFT PYELOPLASTY WITH STENT EXCHANGE  History and Physical: For full details, please see admission history and physical. Briefly, Joshua Farmer is a 79 y.o. year old patient with left UPJ obstruction.   Hospital Course: Patient tolerated the procedure well.  He was then transferred to the floor after an uneventful PACU stay.  His hospital course was uncomplicated.  On POD#1 he had met discharge criteria: was tolerating PO, was up and ambulating independently,  pain was well controlled, was voiding without a catheter, and was ready to for discharge. JP drain output was minimal (did not increase after foley was removed) and was removed prior to discharge.     Laboratory values:  Recent Labs    08/02/20 1243 08/03/20 0438  HGB 12.0* 10.3*  HCT 37.4* 32.2*   Recent Labs    08/03/20 0438  NA 139  K 3.8  CL 108  CO2 24  GLUCOSE 129*  BUN 32*  CREATININE 2.22*  CALCIUM 8.9   No results for input(s): LABPT, INR in the last 72 hours. No results for input(s): LABURIN in the last 72 hours. Results for orders placed or performed during the hospital encounter of 07/30/20  SARS CORONAVIRUS 2 (TAT 6-24 HRS) Nasopharyngeal Nasopharyngeal Swab     Status: None   Collection Time: 07/30/20  9:26 AM   Specimen: Nasopharyngeal Swab  Result Value Ref Range Status   SARS Coronavirus 2 NEGATIVE NEGATIVE Final    Comment: (NOTE) SARS-CoV-2 target nucleic acids are NOT DETECTED.  The SARS-CoV-2 RNA is generally detectable in upper and lower respiratory specimens during the acute phase of infection.  Negative results do not preclude SARS-CoV-2 infection, do not rule out co-infections with other pathogens, and should not be used as the sole basis for treatment or other patient management decisions. Negative results must be combined with clinical observations, patient history, and epidemiological information. The expected result is Negative.  Fact Sheet for Patients: SugarRoll.be  Fact Sheet for Healthcare Providers: https://www.woods-mathews.com/  This test is not yet approved or cleared by the [place] FDA and  has been authorized for detection and/or diagnosis of SARS-CoV-2 by FDA under an Emergency Use Authorization (EUA). This EUA will remain  in effect (meaning this test can be used) for the duration of the COVID-19 declaration under Se ction 564(b)(1) of the Act, 21 U.S.C. section 360bbb-3(b)(1), unless the authorization is terminated or revoked sooner.  Performed at [facility], [place] [address] [place], [place] [address]     Disposition: Home  Discharge instruction: The patient was instructed to be ambulatory but told to refrain from heavy lifting, strenuous activity, or driving.   Discharge medications:  Allergies as of 08/03/2020   No Known Allergies     Medication List    STOP taking these medications   aspirin EC 81 MG tablet   cefUROXime 500 MG tablet Commonly known as: CEFTIN   cephALEXin 250 MG capsule Commonly known as: Keflex   multivitamin tablet   oxybutynin 5 MG tablet Commonly known as: DITROPAN   predniSONE 50 MG tablet Commonly known as: DELTASONE   ZzzQuil 50 MG/30ML Liqd  Generic drug: diphenhydrAMINE HCl     TAKE these medications   acetaminophen 500 MG tablet Commonly known as: TYLENOL Take 1,000 mg by mouth every 6 (six) hours as needed for mild pain.   albuterol 108 (90 Base) MCG/ACT inhaler Commonly known as: VENTOLIN HFA Inhale 2 puffs into the lungs every 6 (six) hours  as needed for wheezing or shortness of breath.   amLODipine 5 MG tablet Commonly known as: NORVASC Take 5 mg by mouth daily.   atorvastatin 40 MG tablet Commonly known as: LIPITOR Take 40 mg by mouth at bedtime.   chlorthalidone 25 MG tablet Commonly known as: HYGROTON Take 25 mg by mouth daily.   esomeprazole 20 MG capsule Commonly known as: NEXIUM Take 20 mg by mouth daily.   Myrbetriq 25 MG Tb24 tablet Generic drug: mirabegron ER Take 25 mg by mouth daily.   tamsulosin 0.4 MG Caps capsule Commonly known as: FLOMAX Take 0.4 mg by mouth at bedtime.   traMADol 50 MG tablet Commonly known as: Ultram Take 1-2 tablets (50-100 mg total) by mouth every 6 (six) hours as needed for moderate pain or severe pain.       Followup:   Follow-up Information    Jed Limerick, NP On 08/16/2020.   Specialty: Urology Why: For wound re-check, Stent removal, 10:15 Contact information: [address] [place] [address] [phone]

## 2020-08-16 DIAGNOSIS — R300 Dysuria: Secondary | ICD-10-CM | POA: Diagnosis not present

## 2020-08-16 DIAGNOSIS — N130 Hydronephrosis with ureteropelvic junction obstruction: Secondary | ICD-10-CM | POA: Diagnosis not present

## 2020-08-30 DIAGNOSIS — N270 Small kidney, unilateral: Secondary | ICD-10-CM | POA: Diagnosis not present

## 2020-08-30 DIAGNOSIS — N130 Hydronephrosis with ureteropelvic junction obstruction: Secondary | ICD-10-CM | POA: Diagnosis not present

## 2020-08-30 DIAGNOSIS — R300 Dysuria: Secondary | ICD-10-CM | POA: Diagnosis not present

## 2020-09-03 DIAGNOSIS — Z125 Encounter for screening for malignant neoplasm of prostate: Secondary | ICD-10-CM | POA: Diagnosis not present

## 2020-09-03 DIAGNOSIS — Z0000 Encounter for general adult medical examination without abnormal findings: Secondary | ICD-10-CM | POA: Diagnosis not present

## 2020-09-03 DIAGNOSIS — E7849 Other hyperlipidemia: Secondary | ICD-10-CM | POA: Diagnosis not present

## 2020-09-03 DIAGNOSIS — Z6829 Body mass index (BMI) 29.0-29.9, adult: Secondary | ICD-10-CM | POA: Diagnosis not present

## 2020-09-03 DIAGNOSIS — N189 Chronic kidney disease, unspecified: Secondary | ICD-10-CM | POA: Diagnosis not present

## 2020-09-03 DIAGNOSIS — N3091 Cystitis, unspecified with hematuria: Secondary | ICD-10-CM | POA: Diagnosis not present

## 2020-09-03 DIAGNOSIS — I10 Essential (primary) hypertension: Secondary | ICD-10-CM | POA: Diagnosis not present

## 2020-09-03 DIAGNOSIS — N401 Enlarged prostate with lower urinary tract symptoms: Secondary | ICD-10-CM | POA: Diagnosis not present

## 2020-10-08 DIAGNOSIS — N130 Hydronephrosis with ureteropelvic junction obstruction: Secondary | ICD-10-CM | POA: Diagnosis not present

## 2020-12-02 DIAGNOSIS — Z79899 Other long term (current) drug therapy: Secondary | ICD-10-CM | POA: Diagnosis not present

## 2020-12-02 DIAGNOSIS — L299 Pruritus, unspecified: Secondary | ICD-10-CM | POA: Diagnosis not present

## 2020-12-02 DIAGNOSIS — L120 Bullous pemphigoid: Secondary | ICD-10-CM | POA: Diagnosis not present

## 2020-12-04 DIAGNOSIS — E7849 Other hyperlipidemia: Secondary | ICD-10-CM | POA: Diagnosis not present

## 2020-12-04 DIAGNOSIS — N401 Enlarged prostate with lower urinary tract symptoms: Secondary | ICD-10-CM | POA: Diagnosis not present

## 2020-12-04 DIAGNOSIS — N189 Chronic kidney disease, unspecified: Secondary | ICD-10-CM | POA: Diagnosis not present

## 2020-12-04 DIAGNOSIS — I10 Essential (primary) hypertension: Secondary | ICD-10-CM | POA: Diagnosis not present

## 2020-12-04 DIAGNOSIS — Z6829 Body mass index (BMI) 29.0-29.9, adult: Secondary | ICD-10-CM | POA: Diagnosis not present

## 2021-01-07 DIAGNOSIS — L299 Pruritus, unspecified: Secondary | ICD-10-CM | POA: Diagnosis not present

## 2021-01-07 DIAGNOSIS — L821 Other seborrheic keratosis: Secondary | ICD-10-CM | POA: Diagnosis not present

## 2021-01-07 DIAGNOSIS — L120 Bullous pemphigoid: Secondary | ICD-10-CM | POA: Diagnosis not present

## 2021-01-14 ENCOUNTER — Emergency Department (HOSPITAL_COMMUNITY)
Admission: EM | Admit: 2021-01-14 | Discharge: 2021-01-14 | Disposition: A | Discharge status: Home / Self Care | Payer: Medicare PPO | Attending: Emergency Medicine | Admitting: Emergency Medicine

## 2021-01-14 ENCOUNTER — Encounter (HOSPITAL_COMMUNITY): Payer: Self-pay | Admitting: Student

## 2021-01-14 ENCOUNTER — Emergency Department (HOSPITAL_COMMUNITY): Payer: Medicare PPO

## 2021-01-14 ENCOUNTER — Other Ambulatory Visit: Payer: Self-pay

## 2021-01-14 DIAGNOSIS — R944 Abnormal results of kidney function studies: Secondary | ICD-10-CM | POA: Diagnosis not present

## 2021-01-14 DIAGNOSIS — Z79899 Other long term (current) drug therapy: Secondary | ICD-10-CM | POA: Diagnosis not present

## 2021-01-14 DIAGNOSIS — R0600 Dyspnea, unspecified: Secondary | ICD-10-CM | POA: Diagnosis not present

## 2021-01-14 DIAGNOSIS — R7989 Other specified abnormal findings of blood chemistry: Secondary | ICD-10-CM

## 2021-01-14 DIAGNOSIS — I10 Essential (primary) hypertension: Secondary | ICD-10-CM | POA: Diagnosis not present

## 2021-01-14 DIAGNOSIS — R0789 Other chest pain: Secondary | ICD-10-CM | POA: Insufficient documentation

## 2021-01-14 DIAGNOSIS — I201 Angina pectoris with documented spasm: Secondary | ICD-10-CM | POA: Diagnosis not present

## 2021-01-14 DIAGNOSIS — J9811 Atelectasis: Secondary | ICD-10-CM | POA: Diagnosis not present

## 2021-01-14 DIAGNOSIS — R079 Chest pain, unspecified: Secondary | ICD-10-CM

## 2021-01-14 DIAGNOSIS — Z6829 Body mass index (BMI) 29.0-29.9, adult: Secondary | ICD-10-CM | POA: Diagnosis not present

## 2021-01-14 DIAGNOSIS — R0602 Shortness of breath: Secondary | ICD-10-CM | POA: Diagnosis not present

## 2021-01-14 DIAGNOSIS — J90 Pleural effusion, not elsewhere classified: Secondary | ICD-10-CM | POA: Diagnosis not present

## 2021-01-14 LAB — COMPREHENSIVE METABOLIC PANEL
ALT: 15 U/L (ref 0–44)
AST: 17 U/L (ref 15–41)
Albumin: 3.5 g/dL (ref 3.5–5.0)
Alkaline Phosphatase: 60 U/L (ref 38–126)
Anion gap: 9 (ref 5–15)
BUN: 44 mg/dL — ABNORMAL HIGH (ref 8–23)
CO2: 22 mmol/L (ref 22–32)
Calcium: 9.3 mg/dL (ref 8.9–10.3)
Chloride: 106 mmol/L (ref 98–111)
Creatinine, Ser: 2.87 mg/dL — ABNORMAL HIGH (ref 0.61–1.24)
GFR, Estimated: 22 mL/min — ABNORMAL LOW (ref >60)
Glucose, Bld: 104 mg/dL — ABNORMAL HIGH (ref 70–99)
Potassium: 4.6 mmol/L (ref 3.5–5.1)
Sodium: 137 mmol/L (ref 135–145)
Total Bilirubin: 0.5 mg/dL (ref 0.3–1.2)
Total Protein: 6.9 g/dL (ref 6.5–8.1)

## 2021-01-14 LAB — CBC WITH DIFFERENTIAL/PLATELET
Abs Immature Granulocytes: 0.02 10*3/uL (ref 0.00–0.07)
Basophils Absolute: 0.1 10*3/uL (ref 0.0–0.1)
Basophils Relative: 1 %
Eosinophils Absolute: 0.2 10*3/uL (ref 0.0–0.5)
Eosinophils Relative: 2 %
HCT: 37.2 % — ABNORMAL LOW (ref 39.0–52.0)
Hemoglobin: 11.9 g/dL — ABNORMAL LOW (ref 13.0–17.0)
Immature Granulocytes: 0 %
Lymphocytes Relative: 26 %
Lymphs Abs: 2.1 10*3/uL (ref 0.7–4.0)
MCH: 31.1 pg (ref 26.0–34.0)
MCHC: 32 g/dL (ref 30.0–36.0)
MCV: 97.1 fL (ref 80.0–100.0)
Monocytes Absolute: 0.9 10*3/uL (ref 0.1–1.0)
Monocytes Relative: 11 %
Neutro Abs: 4.7 10*3/uL (ref 1.7–7.7)
Neutrophils Relative %: 60 %
Platelets: 341 10*3/uL (ref 150–400)
RBC: 3.83 MIL/uL — ABNORMAL LOW (ref 4.22–5.81)
RDW: 13.7 % (ref 11.5–15.5)
WBC: 7.9 10*3/uL (ref 4.0–10.5)
nRBC: 0 % (ref 0.0–0.2)

## 2021-01-14 LAB — TROPONIN I (HIGH SENSITIVITY)
Troponin I (High Sensitivity): 7 ng/L (ref <18)
Troponin I (High Sensitivity): 7 ng/L (ref <18)

## 2021-01-14 MED ORDER — LIDOCAINE 5 % EX PTCH
1.0000 | MEDICATED_PATCH | CUTANEOUS | Status: DC
  Administered 2021-01-14: 1 via TRANSDERMAL
  Filled 2021-01-14: qty 1

## 2021-01-14 MED ORDER — LIDOCAINE 5 % EX PTCH: 1.0000 | MEDICATED_PATCH | Freq: Every day | CUTANEOUS | 0 refills | Status: DC | PRN

## 2021-01-14 MED ORDER — FENTANYL CITRATE (PF) 100 MCG/2ML IJ SOLN
50.0000 ug | Freq: Once | INTRAMUSCULAR | Status: AC
  Administered 2021-01-14: 50 ug via INTRAVENOUS
  Filled 2021-01-14: qty 2

## 2021-01-14 MED ORDER — ASPIRIN 81 MG PO CHEW
324.0000 mg | CHEWABLE_TABLET | Freq: Once | ORAL | Status: AC
  Administered 2021-01-14: 324 mg via ORAL
  Filled 2021-01-14: qty 4

## 2021-01-14 NOTE — ED Provider Notes (Signed)
[facility] EMERGENCY DEPARTMENT Provider Note   CSN: [id number] Arrival date & time: 01/14/21  1818     History Chief Complaint  Patient presents with  . Chest Pain    Joshua Farmer is a 80 y.o. male with a history of hypertension, hypercholesterolemia, GERD, & prior nephrolithiasis who presents to the ED with complaints of chest pain x 4 days. Patient states pain is located to the left anterior chest, radiates through to left shoulder blade, sharp in nature, constant, no alleviating/aggravating factors, no change with exertion or deep breathing. Has mild associated dyspnea at times.  Denies nausea, vomiting, diaphoresis, dizziness, syncope, unilateral leg pain/swelling, hemoptysis, recent surgery/trauma, recent long travel, hormone use, personal hx of cancer, or hx of DVT/PE. Denies personal hx of CAD.   HPI     Past Medical History:  Diagnosis Date  . Gastroenteritis   . GERD (gastroesophageal reflux disease)   . High cholesterol   . History of kidney stones 2001  . Hypertension     Patient Active Problem List   Diagnosis Date Noted  . 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 Hypertension   . High cholesterol   . Gastroenteritis     Past Surgical History:  Procedure Laterality Date  . APPENDECTOMY    . CYSTOSCOPY W/ URETERAL STENT PLACEMENT Left 03/25/2020   Procedure: CYSTOSCOPY WITH RETROGRADE PYELOGRAM/URETERAL STENT PLACEMENT;  Surgeon: Franchot Gallo, MD;  Location: [facility];  Service: Urology;  Laterality: Left;  30 MINS  . ROBOT ASSISTED PYELOPLASTY Left 08/02/2020   Procedure: XI ROBOTIC ASSISTED LEFT PYELOPLASTY WITH STENT EXCHANGE;  Surgeon: Ardis Hughs, MD;  Location: [facility];  Service: Urology;  Laterality: Left;       History reviewed. No pertinent family history.  Social History   Tobacco Use  . Smoking status: Never Smoker  . Smokeless tobacco: Never Used  Vaping Use  . Vaping Use: Never used  Substance Use Topics  .  Alcohol use: No  . Drug use: No    Home Medications Prior to Admission medications   Medication Sig Start Date End Date Taking? Authorizing Provider  acetaminophen (TYLENOL) 500 MG tablet Take 1,000 mg by mouth every 6 (six) hours as needed for mild pain.    [provider]  albuterol (VENTOLIN HFA) 108 (90 Base) MCG/ACT inhaler Inhale 2 puffs into the lungs every 6 (six) hours as needed for wheezing or shortness of breath.  02/09/19   [provider]  amLODipine (NORVASC) 5 MG tablet Take 5 mg by mouth daily. 11/22/19   [provider]  atorvastatin (LIPITOR) 40 MG tablet Take 40 mg by mouth at bedtime.  07/16/19   [provider]  chlorthalidone (HYGROTON) 25 MG tablet Take 25 mg by mouth daily. 02/05/20   [provider]  esomeprazole (NEXIUM) 20 MG capsule Take 20 mg by mouth daily.     [provider]  mirabegron ER (MYRBETRIQ) 25 MG TB24 tablet Take 25 mg by mouth daily.    [provider]  tamsulosin (FLOMAX) 0.4 MG CAPS capsule Take 0.4 mg by mouth at bedtime. 02/14/20   [provider]  traMADol (ULTRAM) 50 MG tablet Take 1-2 tablets (50-100 mg total) by mouth every 6 (six) hours as needed for moderate pain or severe pain. 08/02/20   Debbrah Alar, PA-C    Allergies    Patient has no known allergies.  Review of Systems   Review of Systems  Constitutional: Negative for chills, diaphoresis and  fever.  Respiratory: Positive for shortness of breath.   Cardiovascular: Positive for chest pain. Negative for leg swelling.  Gastrointestinal: Negative for abdominal pain, nausea and vomiting.  Neurological: Negative for dizziness and syncope.  All other systems reviewed and are negative.   Physical Exam Updated Vital Signs BP (!) 164/78 (BP Location: Left Arm)   Pulse 66   Temp 97.9 F (36.6 C) (Oral)   Resp 18   SpO2 100%   Physical Exam Vitals and nursing note reviewed.  Constitutional:      General: He is not  in acute distress.    Appearance: He is well-developed. He is not toxic-appearing.  HENT:     Head: Normocephalic and atraumatic.  Eyes:     General:        Right eye: No discharge.        Left eye: No discharge.     Conjunctiva/sclera: Conjunctivae normal.  Cardiovascular:     Rate and Rhythm: Normal rate and regular rhythm.     Pulses:          Radial pulses are 2+ on the right side and 2+ on the left side.  Pulmonary:     Effort: Pulmonary effort is normal. No respiratory distress.     Breath sounds: Normal breath sounds. No wheezing, rhonchi or rales.  Chest:     Chest wall: Tenderness (Left superior anterior chest wall which reproduces patient's pain) present.  Abdominal:     General: There is no distension.     Palpations: Abdomen is soft.     Tenderness: There is no abdominal tenderness.  Musculoskeletal:     Cervical back: Neck supple.     Right lower leg: No tenderness. No edema.     Left lower leg: No tenderness. No edema.  Skin:    General: Skin is warm and dry.     Findings: No rash.  Neurological:     Mental Status: He is alert.     Comments: Clear speech.   Psychiatric:        Behavior: Behavior normal.     ED Results / Procedures / Treatments   Labs (all labs ordered are listed, but only abnormal results are displayed) Labs Reviewed  CBC WITH DIFFERENTIAL/PLATELET - Abnormal; Notable for the following components:      Result Value   RBC 3.83 (*)    Hemoglobin 11.9 (*)    HCT 37.2 (*)    All other components within normal limits  COMPREHENSIVE METABOLIC PANEL - Abnormal; Notable for the following components:   Glucose, Bld 104 (*)    BUN 44 (*)    Creatinine, Ser 2.87 (*)    GFR, Estimated 22 (*)    All other components within normal limits  TROPONIN I (HIGH SENSITIVITY)  TROPONIN I (HIGH SENSITIVITY)    EKG EKG Interpretation  Date/Time:  Tuesday January 14 2021 18:43:51 EDT Ventricular Rate:  67 PR Interval:    QRS Duration: 78 QT  Interval:  394 QTC Calculation: 416 R Axis:   26 Text Interpretation: Sinus rhythm Abnormal R-wave progression, early transition Minimal ST depression No significant change since last tracing Confirmed by Nanda Quinton [phone]) on 01/14/2021 7:00:09 PM   Radiology DG Chest Portable 1 View  Result Date: 01/14/2021 CLINICAL DATA:  Left-sided chest pain.  Shortness of breath. EXAM: PORTABLE CHEST 1 VIEW COMPARISON:  Report from chest radiograph 02/08/2019, images not available. FINDINGS: Lung volumes are low. Mild elevation of right hemidiaphragm. Normal heart size  for technique. Aortic atherosclerosis and tortuosity. Minimal blunting of left costophrenic angle may be pleuroparenchymal scarring or small effusion. Streaky retrocardiac opacities. No pneumothorax. No pulmonary edema. No confluent consolidation. No acute osseous abnormalities are seen. IMPRESSION: 1. Minimal blunting of left costophrenic angle may be pleuroparenchymal scarring or small effusion. 2. Streaky retrocardiac opacities typically atelectasis. 3. Aortic atherosclerosis and tortuosity. Electronically Signed   By: Keith Rake M.D.   On: 01/14/2021 19:13    Procedures Procedures   Medications Ordered in ED Medications  lidocaine (LIDODERM) 5 % 1 patch (1 patch Transdermal Patch Applied 01/14/21 2151)  aspirin chewable tablet 324 mg (324 mg Oral Given 01/14/21 1930)  fentaNYL (SUBLIMAZE) injection 50 mcg (50 mcg Intravenous Given 01/14/21 2001)    ED Course  I have reviewed the triage vital signs and the nursing notes.  Pertinent labs & imaging results that were available during my care of the patient were reviewed by me and considered in my medical decision making (see chart for details).    MDM Rules/Calculators/A&P                         Patient presents to the emergency department with chest pain. Patient nontoxic appearing, in no apparent distress, vitals with elevated blood pressure, otherwise unremarkable.  On exam  patient does have left chest wall tenderness to palpation.  Otherwise benign exam.  Additional history obtained:  Additional history obtained from chart review & nursing note review.   EKG: No significant change compared to prior, no STEMI.  Lab Tests:  I Ordered, reviewed, and interpreted labs, which included:  CBC: Anemia similar to prior BMP: Renal function similar to prior ranges Troponin: Within normal limits for initial.  Imaging Studies ordered:  I ordered imaging studies which included CXR, I independently reviewed, formal radiology impression shows: 1. Minimal blunting of left costophrenic angle may be pleuroparenchymal scarring or small effusion. 2. Streaky retrocardiac opacities typically atelectasis. 3. Aortic atherosclerosis and tortuosity.  ED Course:  Heart Pathway Score 4- EKG without obvious acute ischemia, delta troponin negative, reproducible pain, low suspicion for ACS. Patient is low risk wells, low suspicion for pulmonary embolism. Pain is not a tearing sensation, symmetric pulses, no widening of mediastinum on CXR, doubt dissection. Cardiac monitor reviewed, no notable arrhythmias or tachycardia. On reassessment patient is feeling improved. Unclear definitive etiology to patient's pain, small effusion vs. Scarring is same side, but would be odd given pain is higher up, pain is reproducible with chest wall palpation, will prescribe Lidoderm patches, recommended taking Tylenol, and will have patient follow-up with his primary care provider and provide information for cardiology as well. I discussed results, treatment plan, need for follow-up, and return precautions with the patient. Provided opportunity for questions, patient confirmed understanding and is in agreement with plan.   This is a shared visit with supervising physician Dr. Laverta Baltimore who has independently evaluated patient & provided guidance, in agreement with care   Portions of this note were generated with Dragon  dictation software. Dictation errors may occur despite best attempts at proofreading.  Final Clinical Impression(s) / ED Diagnoses Final diagnoses:  Chest pain, unspecified type  Elevated serum creatinine    Rx / DC Orders ED Discharge Orders         Ordered    lidocaine (LIDODERM) 5 %  Daily PRN        01/14/21 2232           Zuley Lutter, Rotonda R,  PA-C 01/14/21 2233    Margette Fast, MD 01/15/21 1144

## 2021-01-14 NOTE — ED Triage Notes (Signed)
Pt c/o left sided chest pain and pain at left shoulder blade x 3-4 days. Pt reports SOB at times since the chest pain started.

## 2021-01-14 NOTE — Discharge Instructions (Addendum)
You were seen in the emergency department today for chest pain. Your work-up in the emergency department has been overall reassuring. Your labs have been fairly normal and or similar to previous blood work you have had done-of note your creatinine which looks at your kidney function was elevated, please have this closely rechecked by your primary care provider.. Your EKG and the enzyme we use to check your heart did not show an acute heart attack at this time. Your chest x-ray showed some possible scarring versus a small amount of fluid at your left lung base, please discussed with your primary care provider in follow-up.  We are sending you home with a prescription for Lidoderm patches, please apply 1 patch to area most significant pain once per day.  Remove and discard patch within 12 hours. We have prescribed you new medication(s) today. Discuss the medications prescribed today with your pharmacist as they can have adverse effects and interactions with your other medicines including over the counter and prescribed medications. Seek medical evaluation if you start to experience new or abnormal symptoms after taking one of these medicines, seek care immediately if you start to experience difficulty breathing, feeling of your throat closing, facial swelling, or rash as these could be indications of a more serious allergic reaction  You may take Tylenol per over-the-counter dosing as needed for pain as well.  We would like you to follow up closely with your primary care provider and/or the cardiologist provided in your discharge instructions within 1-3 days. Return to the ER immediately should you experience any new or worsening symptoms including but not limited to return of pain, worsened pain, vomiting, shortness of breath, dizziness, lightheadedness, passing out, or any other concerns that you may have.

## 2021-01-29 DIAGNOSIS — I10 Essential (primary) hypertension: Secondary | ICD-10-CM | POA: Diagnosis not present

## 2021-01-29 DIAGNOSIS — E7849 Other hyperlipidemia: Secondary | ICD-10-CM | POA: Diagnosis not present

## 2021-02-04 DIAGNOSIS — R351 Nocturia: Secondary | ICD-10-CM | POA: Diagnosis not present

## 2021-02-04 DIAGNOSIS — N130 Hydronephrosis with ureteropelvic junction obstruction: Secondary | ICD-10-CM | POA: Diagnosis not present

## 2021-03-01 DIAGNOSIS — I10 Essential (primary) hypertension: Secondary | ICD-10-CM | POA: Diagnosis not present

## 2021-03-01 DIAGNOSIS — K219 Gastro-esophageal reflux disease without esophagitis: Secondary | ICD-10-CM | POA: Diagnosis not present

## 2021-04-23 DIAGNOSIS — Z6829 Body mass index (BMI) 29.0-29.9, adult: Secondary | ICD-10-CM | POA: Diagnosis not present

## 2021-04-23 DIAGNOSIS — I201 Angina pectoris with documented spasm: Secondary | ICD-10-CM | POA: Diagnosis not present

## 2021-04-23 DIAGNOSIS — E7849 Other hyperlipidemia: Secondary | ICD-10-CM | POA: Diagnosis not present

## 2021-04-23 DIAGNOSIS — N189 Chronic kidney disease, unspecified: Secondary | ICD-10-CM | POA: Diagnosis not present

## 2021-04-23 DIAGNOSIS — K2100 Gastro-esophageal reflux disease with esophagitis, without bleeding: Secondary | ICD-10-CM | POA: Diagnosis not present

## 2021-04-23 DIAGNOSIS — I10 Essential (primary) hypertension: Secondary | ICD-10-CM | POA: Diagnosis not present

## 2021-05-01 DIAGNOSIS — E7849 Other hyperlipidemia: Secondary | ICD-10-CM | POA: Diagnosis not present

## 2021-05-01 DIAGNOSIS — N189 Chronic kidney disease, unspecified: Secondary | ICD-10-CM | POA: Diagnosis not present

## 2021-05-01 DIAGNOSIS — K2100 Gastro-esophageal reflux disease with esophagitis, without bleeding: Secondary | ICD-10-CM | POA: Diagnosis not present

## 2021-05-01 DIAGNOSIS — I10 Essential (primary) hypertension: Secondary | ICD-10-CM | POA: Diagnosis not present

## 2021-06-01 DIAGNOSIS — E7849 Other hyperlipidemia: Secondary | ICD-10-CM | POA: Diagnosis not present

## 2021-06-01 DIAGNOSIS — I10 Essential (primary) hypertension: Secondary | ICD-10-CM | POA: Diagnosis not present

## 2021-06-01 DIAGNOSIS — N189 Chronic kidney disease, unspecified: Secondary | ICD-10-CM | POA: Diagnosis not present

## 2021-07-02 DIAGNOSIS — I10 Essential (primary) hypertension: Secondary | ICD-10-CM | POA: Diagnosis not present

## 2021-07-02 DIAGNOSIS — N189 Chronic kidney disease, unspecified: Secondary | ICD-10-CM | POA: Diagnosis not present

## 2021-07-02 DIAGNOSIS — K2100 Gastro-esophageal reflux disease with esophagitis, without bleeding: Secondary | ICD-10-CM | POA: Diagnosis not present

## 2021-07-02 DIAGNOSIS — E7849 Other hyperlipidemia: Secondary | ICD-10-CM | POA: Diagnosis not present

## 2021-07-28 IMAGING — MR MR LUMBAR SPINE W/O CM
4 of 5 series · 13 of 48 positions shown · non-contrast
Comparison: Lumbar discogram CT 07/24/2010

CLINICAL DATA: Chronic low back pain, worsening for 8 months.
Weakness in the right leg

EXAM:
MRI LUMBAR SPINE WITHOUT CONTRAST
TECHNIQUE: Multiplanar, multisequence MR imaging of the lumbar spine was
performed. No intravenous contrast was administered.

[Series 3: T2 · sagittal · 4.0mm · 0.45mm/px · 4 of 17 slices shown (1 of 3)]
[im 1/17]
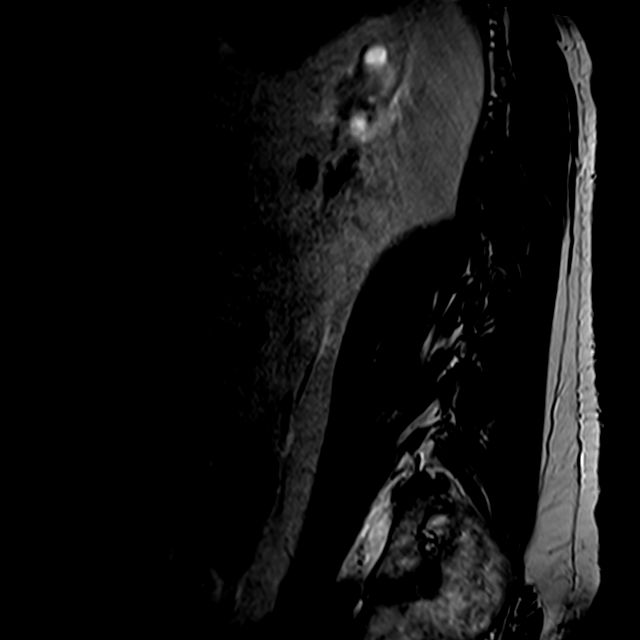
[im 4/17]
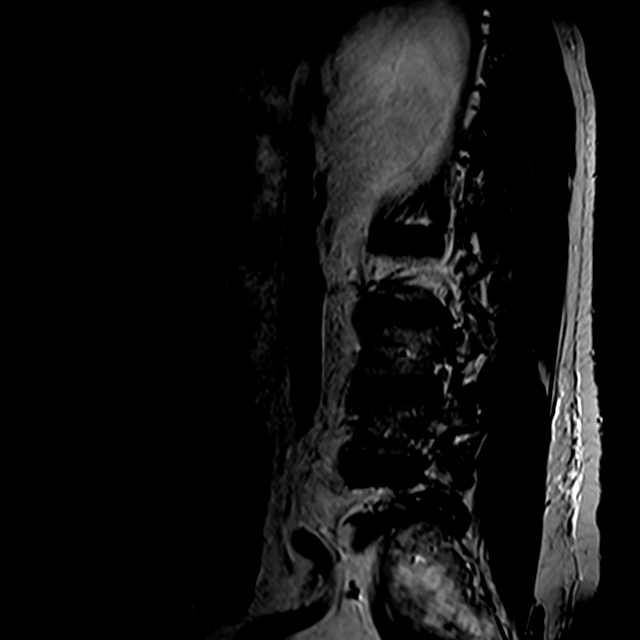
[im 10/17]
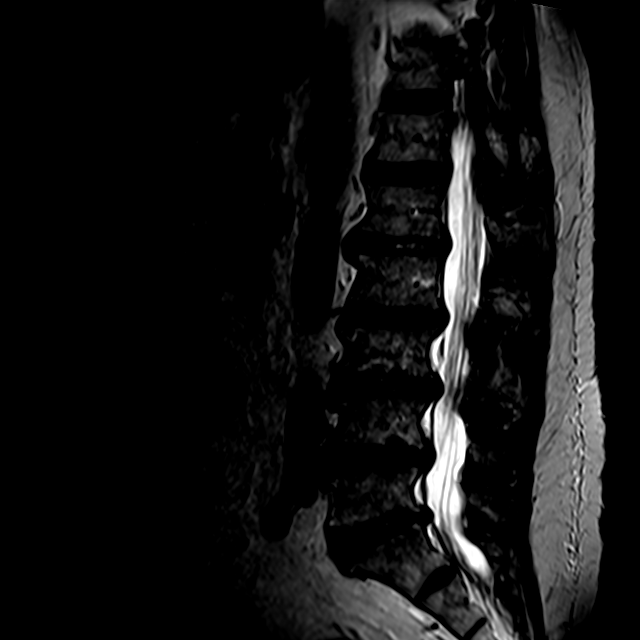
[im 17/17]
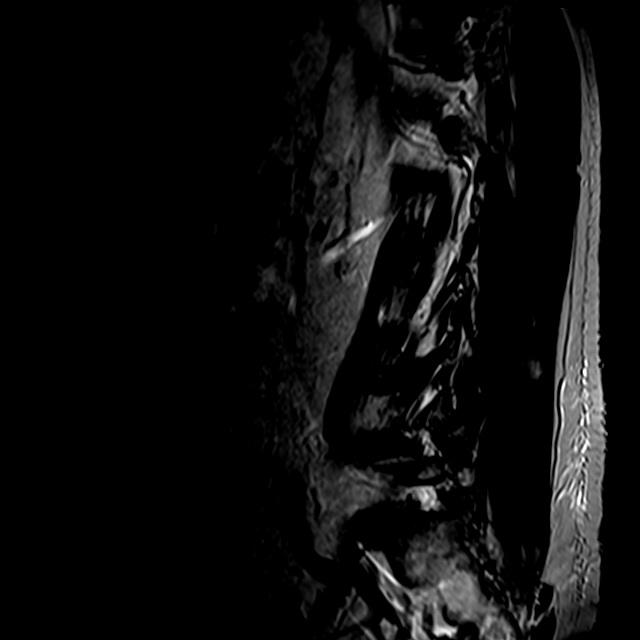

[Series 4: T1 · sagittal · 4.0mm · 0.45mm/px · 3 of 17 slices shown]
[im 4/17]
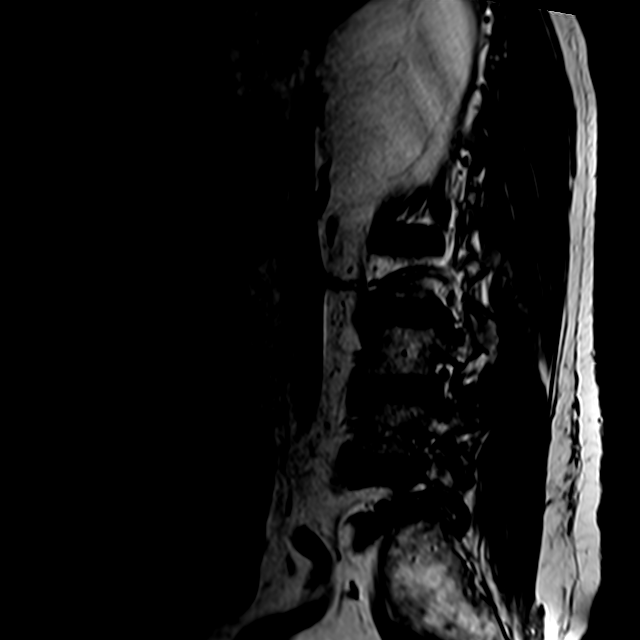
[im 10/17]
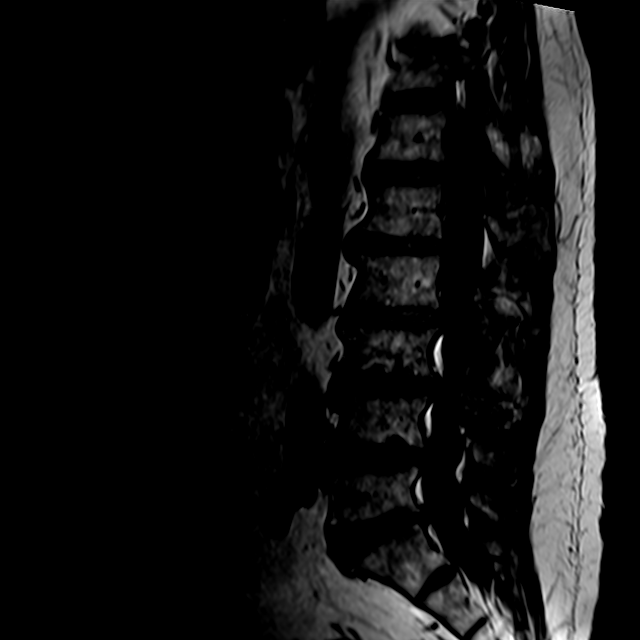
[im 17/17]
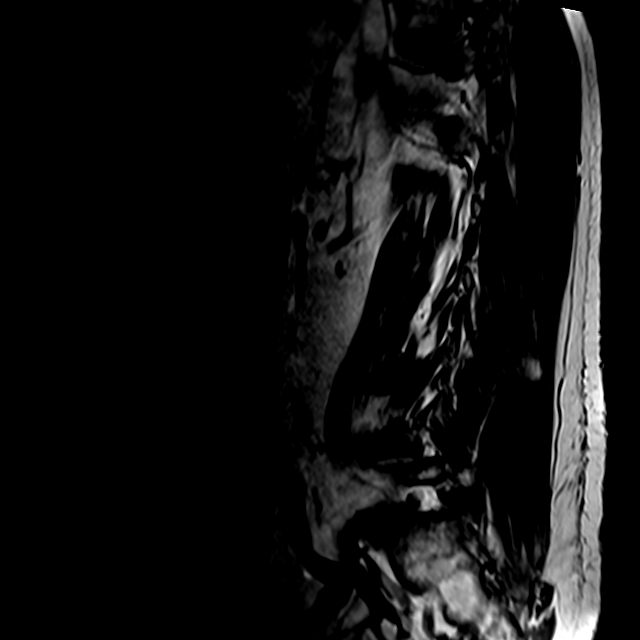

[Series 5: T2 · sagittal · 4.0mm · 0.45mm/px · 3 of 20 slices shown (2 of 3)]
[im 3/20]
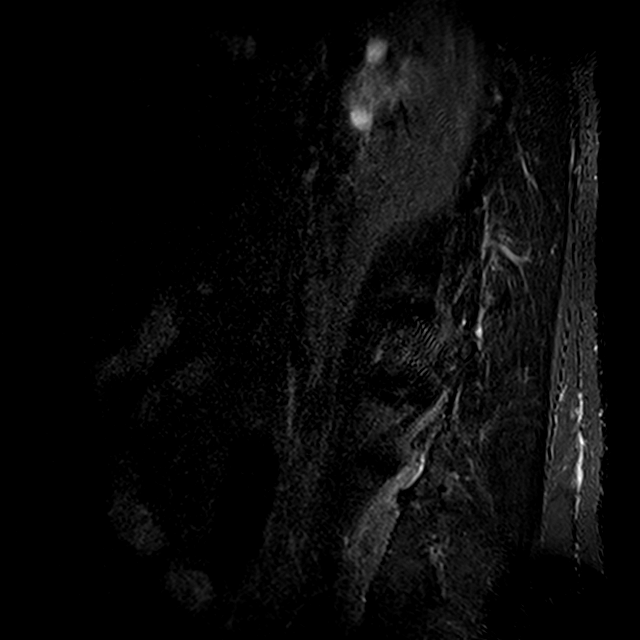
[im 11/20]
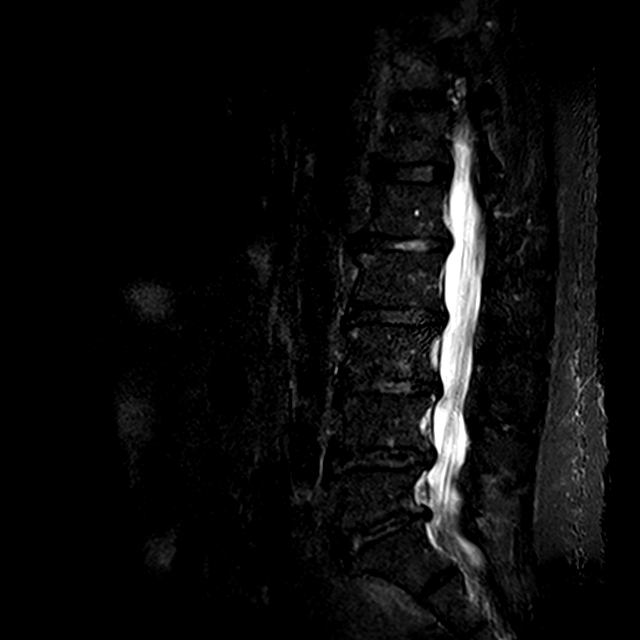
[im 17/20]
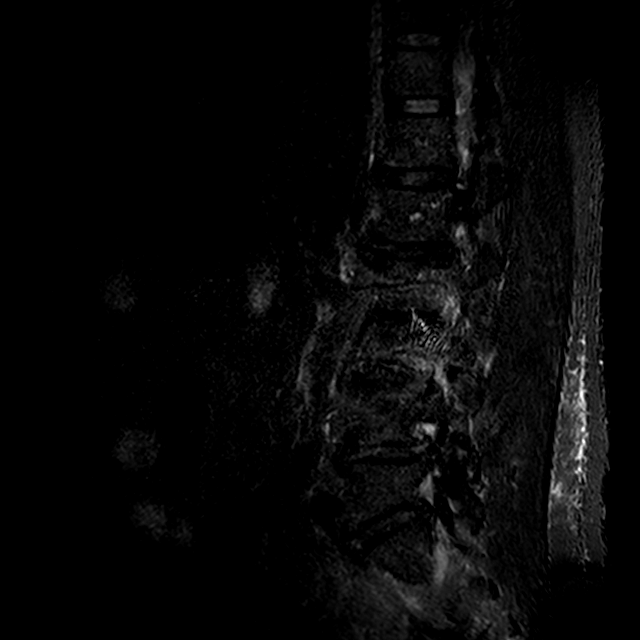

[Series 6: T2 · axial · 4.0mm · 0.33mm/px · z∈[-88,+28]mm · 3 of 36 slices shown (3 of 3)]
[im 6/36]
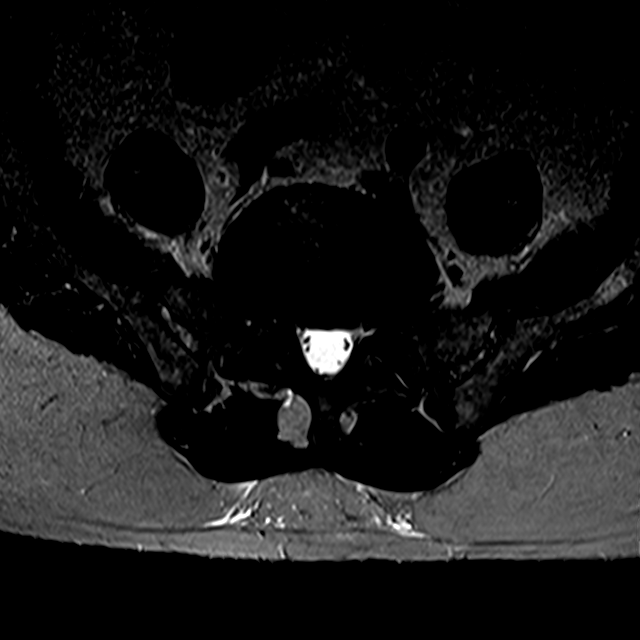
[im 19/36]
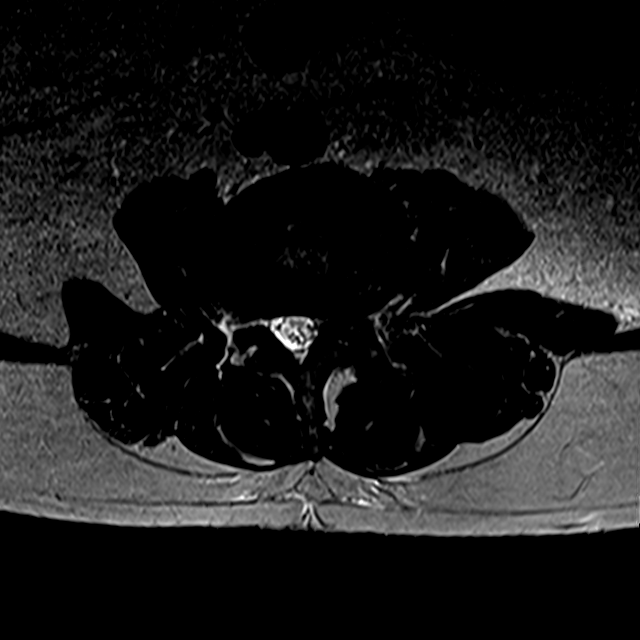
[im 30/36]
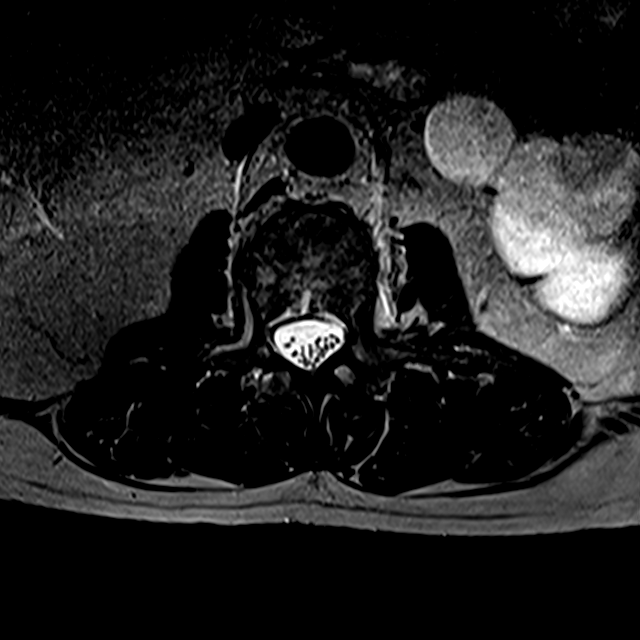

[13 of 48 positions shown; findings below may reference images not displayed]

FINDINGS: Segmentation:  Standard lumbar numbering

Alignment: Lower lumbar levoscoliosis and upper lumbar
dextrocurvature.

Vertebrae:  No fracture, evidence of discitis, or bone lesion.

Conus medullaris and cauda equina: Conus extends to the L1 level.
Conus and cauda equina appear normal.

Paraspinal and other soft tissues: Marked hydronephrosis with
cortical thinning on the left. This was not noted on 0159 CT.

Disc levels:

T12- L1: Unremarkable.

L1-L2: Disc narrowing and foraminal predominant bulging. Negative
facets and no impingement

L2-L3: Disc narrowing and bulging with mild facet spurring. The
canal and foramina remain patent

L3-L4: Disc narrowing and endplate degeneration with bulging that is
asymmetric to the left, where there is also greater facet
hypertrophy. Advanced left foraminal stenosis with L3 impingement.
Mild spinal stenosis.

L4-L5: Disc narrowing and bulging with posterior element
hypertrophy. Moderate biforaminal impingement. Mild spinal stenosis.

L5-S1:Disc narrowing with endplate degeneration and bulging
asymmetric to the right. Asymmetric right degenerative facet
spurring. Severe right foraminal stenosis L5 compression.
IMPRESSION: 1. Multilevel disc and facet degeneration with scoliosis and
asymmetric involvement.
2. L5-S1 severe right foraminal impingement with L5 compression.
3. L3-4 high-grade left foraminal impingement.
4. L4-5 moderate bilateral foraminal narrowing.
5. Left hydronephrosis and renal atrophy not described on 0159
abdominal CT, recommend urology follow-up.

## 2021-11-05 DIAGNOSIS — N189 Chronic kidney disease, unspecified: Secondary | ICD-10-CM | POA: Diagnosis not present

## 2021-11-05 DIAGNOSIS — Z6828 Body mass index (BMI) 28.0-28.9, adult: Secondary | ICD-10-CM | POA: Diagnosis not present

## 2021-11-05 DIAGNOSIS — I10 Essential (primary) hypertension: Secondary | ICD-10-CM | POA: Diagnosis not present

## 2021-11-05 DIAGNOSIS — K2100 Gastro-esophageal reflux disease with esophagitis, without bleeding: Secondary | ICD-10-CM | POA: Diagnosis not present

## 2021-11-05 DIAGNOSIS — E7849 Other hyperlipidemia: Secondary | ICD-10-CM | POA: Diagnosis not present

## 2021-11-05 DIAGNOSIS — Z0000 Encounter for general adult medical examination without abnormal findings: Secondary | ICD-10-CM | POA: Diagnosis not present

## 2021-11-09 ENCOUNTER — Emergency Department (HOSPITAL_COMMUNITY): Payer: Medicare PPO

## 2021-11-09 ENCOUNTER — Encounter (HOSPITAL_COMMUNITY): Payer: Self-pay

## 2021-11-09 ENCOUNTER — Emergency Department (HOSPITAL_COMMUNITY)
Admission: EM | Admit: 2021-11-09 | Discharge: 2021-11-10 | Disposition: A | Discharge status: Home / Self Care | Payer: Medicare PPO | Attending: Emergency Medicine | Admitting: Emergency Medicine

## 2021-11-09 ENCOUNTER — Other Ambulatory Visit: Payer: Self-pay

## 2021-11-09 DIAGNOSIS — R350 Frequency of micturition: Secondary | ICD-10-CM | POA: Diagnosis not present

## 2021-11-09 DIAGNOSIS — N281 Cyst of kidney, acquired: Secondary | ICD-10-CM | POA: Diagnosis not present

## 2021-11-09 DIAGNOSIS — I129 Hypertensive chronic kidney disease with stage 1 through stage 4 chronic kidney disease, or unspecified chronic kidney disease: Secondary | ICD-10-CM | POA: Diagnosis not present

## 2021-11-09 DIAGNOSIS — N200 Calculus of kidney: Secondary | ICD-10-CM | POA: Diagnosis not present

## 2021-11-09 DIAGNOSIS — N1832 Chronic kidney disease, stage 3b: Secondary | ICD-10-CM | POA: Diagnosis not present

## 2021-11-09 DIAGNOSIS — N433 Hydrocele, unspecified: Secondary | ICD-10-CM | POA: Diagnosis not present

## 2021-11-09 DIAGNOSIS — Z79899 Other long term (current) drug therapy: Secondary | ICD-10-CM | POA: Diagnosis not present

## 2021-11-09 DIAGNOSIS — Z20822 Contact with and (suspected) exposure to covid-19: Secondary | ICD-10-CM | POA: Insufficient documentation

## 2021-11-09 DIAGNOSIS — R0789 Other chest pain: Secondary | ICD-10-CM | POA: Diagnosis not present

## 2021-11-09 DIAGNOSIS — R079 Chest pain, unspecified: Secondary | ICD-10-CM | POA: Diagnosis not present

## 2021-11-09 DIAGNOSIS — I10 Essential (primary) hypertension: Secondary | ICD-10-CM | POA: Diagnosis not present

## 2021-11-09 LAB — TROPONIN I (HIGH SENSITIVITY)
Troponin I (High Sensitivity): 10 ng/L (ref <18)
Troponin I (High Sensitivity): 11 ng/L (ref <18)

## 2021-11-09 LAB — BASIC METABOLIC PANEL
Anion gap: 11 (ref 5–15)
BUN: 47 mg/dL — ABNORMAL HIGH (ref 8–23)
CO2: 23 mmol/L (ref 22–32)
Calcium: 10.3 mg/dL (ref 8.9–10.3)
Chloride: 109 mmol/L (ref 98–111)
Creatinine, Ser: 2.94 mg/dL — ABNORMAL HIGH (ref 0.61–1.24)
GFR, Estimated: 21 mL/min — ABNORMAL LOW (ref >60)
Glucose, Bld: 103 mg/dL — ABNORMAL HIGH (ref 70–99)
Potassium: 3.8 mmol/L (ref 3.5–5.1)
Sodium: 143 mmol/L (ref 135–145)

## 2021-11-09 LAB — CBC
HCT: 38.6 % — ABNORMAL LOW (ref 39.0–52.0)
Hemoglobin: 12.3 g/dL — ABNORMAL LOW (ref 13.0–17.0)
MCH: 30.6 pg (ref 26.0–34.0)
MCHC: 31.9 g/dL (ref 30.0–36.0)
MCV: 96 fL (ref 80.0–100.0)
Platelets: 341 10*3/uL (ref 150–400)
RBC: 4.02 MIL/uL — ABNORMAL LOW (ref 4.22–5.81)
RDW: 14.1 % (ref 11.5–15.5)
WBC: 8.3 10*3/uL (ref 4.0–10.5)
nRBC: 0 % (ref 0.0–0.2)

## 2021-11-09 NOTE — ED Triage Notes (Signed)
Patient complains of chest pain since yesterday and describes as constant. Patient also complains of headache that is localized to left temporal area. Patient alert and oriented. Patient also complains of left arm pain yesterday

## 2021-11-09 NOTE — ED Provider Triage Note (Signed)
Emergency Medicine Provider Triage Evaluation Note  Joshua Farmer , a 81 y.o. male  was evaluated in triage.  Pt complains of chest pain onset yesterday midday, pressure, and "feeling like muscle pulling". No diabetes, tobacco use. Previous clean heart cath >5 years ago. Strong family hx of acs. Not worse with exertion. Did feel some diaphoresis yesterday and this morning. Feels like it is getting worse. Took nitro yesterday with some improvement. Hx HTN, HLD. Recent Covid 12/3.  Review of Systems  Positive: Chest pain, sob Negative: Nausea, vomiting  Physical Exam  BP 134/88 (BP Location: Right Arm)    Pulse (!) 102    Temp 98.5 F (36.9 C) (Oral)    Resp 17    SpO2 98%  Gen:   Awake, no distress   Resp:  Normal effort  MSK:   Moves extremities without difficulty  Other:  No ttp abdomen  Medical Decision Making  Medically screening exam initiated at 5:00 PM.  Appropriate orders placed.  [name] was informed that the remainder of the evaluation will be completed by another provider, this initial triage assessment does not replace that evaluation, and the importance of remaining in the ED until their evaluation is complete.  Chest pain workup initiated   [name], PA-C 11/09/21 1703

## 2021-11-10 ENCOUNTER — Emergency Department (HOSPITAL_COMMUNITY): Payer: Medicare PPO

## 2021-11-10 DIAGNOSIS — N281 Cyst of kidney, acquired: Secondary | ICD-10-CM | POA: Diagnosis not present

## 2021-11-10 DIAGNOSIS — N433 Hydrocele, unspecified: Secondary | ICD-10-CM | POA: Diagnosis not present

## 2021-11-10 DIAGNOSIS — N200 Calculus of kidney: Secondary | ICD-10-CM | POA: Diagnosis not present

## 2021-11-10 DIAGNOSIS — I10 Essential (primary) hypertension: Secondary | ICD-10-CM | POA: Diagnosis not present

## 2021-11-10 LAB — URINALYSIS, ROUTINE W REFLEX MICROSCOPIC
Bilirubin Urine: NEGATIVE
Glucose, UA: NEGATIVE mg/dL
Hgb urine dipstick: NEGATIVE
Ketones, ur: NEGATIVE mg/dL
Leukocytes,Ua: NEGATIVE
Nitrite: NEGATIVE
Protein, ur: NEGATIVE mg/dL
Specific Gravity, Urine: 1.02 (ref 1.005–1.030)
pH: 6 (ref 5.0–8.0)

## 2021-11-10 LAB — HEPATIC FUNCTION PANEL
ALT: 15 U/L (ref 0–44)
AST: 21 U/L (ref 15–41)
Albumin: 3.5 g/dL (ref 3.5–5.0)
Alkaline Phosphatase: 65 U/L (ref 38–126)
Bilirubin, Direct: 0.1 mg/dL (ref 0.0–0.2)
Indirect Bilirubin: 0.6 mg/dL (ref 0.3–0.9)
Total Bilirubin: 0.7 mg/dL (ref 0.3–1.2)
Total Protein: 7.3 g/dL (ref 6.5–8.1)

## 2021-11-10 LAB — TROPONIN I (HIGH SENSITIVITY): Troponin I (High Sensitivity): 11 ng/L (ref <18)

## 2021-11-10 LAB — RESP PANEL BY RT-PCR (FLU A&B, COVID) ARPGX2
Influenza A by PCR: NEGATIVE
Influenza B by PCR: NEGATIVE
SARS Coronavirus 2 by RT PCR: NEGATIVE

## 2021-11-10 MED ORDER — TAMSULOSIN HCL 0.4 MG PO CAPS: 0.4000 mg | ORAL_CAPSULE | Freq: Every day | ORAL | 0 refills | Status: DC

## 2021-11-10 MED ORDER — SODIUM CHLORIDE 0.9 % IV BOLUS
1000.0000 mL | Freq: Once | INTRAVENOUS | Status: AC
  Administered 2021-11-10: 1000 mL via INTRAVENOUS

## 2021-11-10 NOTE — ED Notes (Signed)
Patient transported to CT 

## 2021-11-10 NOTE — ED Provider Notes (Signed)
[facility] EMERGENCY DEPARTMENT Provider Note   CSN: [id number] Arrival date & time: 11/09/21  1607     History  No chief complaint on file.   Joshua Farmer is a 81 y.o. male.  Pt presents to the ED today with CP since yesterday.  Pt said he started feeling sick on Friday, 1/6.  Pt's wife said he checked his orthostatics and his bp dropped to the mid-70s and HR went up to the 120s.  Pt felt sick all day on the 7th and 8th.  He came to the ED last night and waited for several hours.  Pt denies any cp now.  He does note that he has some left sided flank pain.  He is concerned he has another obstruction in his left ureter.  Pt has had to have a stent for chronic UPJ obstruction.  He also urinates frequently at night.  No dysuria.  No fevers.      Home Medications Prior to Admission medications   Medication Sig Start Date End Date Taking? Authorizing Provider  acetaminophen (TYLENOL) 500 MG tablet Take 1,000 mg by mouth every 6 (six) hours as needed for mild pain.   Yes [provider]  alfuzosin (UROXATRAL) 10 MG 24 hr tablet Take 10 mg by mouth daily with breakfast.   Yes [provider]  amLODipine (NORVASC) 5 MG tablet Take 5 mg by mouth daily. 11/22/19  Yes [provider]  atorvastatin (LIPITOR) 40 MG tablet Take 40 mg by mouth at bedtime.  07/16/19  Yes [provider]  furosemide (LASIX) 20 MG tablet Take 20 mg by mouth daily.   Yes [provider]  Multiple Vitamins-Minerals (ONE-A-DAY MENS 50+) TABS Take 1 tablet by mouth daily.   Yes [provider]  albuterol (VENTOLIN HFA) 108 (90 Base) MCG/ACT inhaler Inhale 2 puffs into the lungs every 6 (six) hours as needed for wheezing or shortness of breath.  Patient not taking: Reported on 01/14/2021 02/09/19   [provider]  chlorthalidone (HYGROTON) 25 MG tablet Take 25 mg by mouth daily. Patient not taking: Reported on 11/10/2021 02/05/20   [provider]  lidocaine (LIDODERM) 5 % Place 1 patch onto the skin daily as needed. Apply patch to area most significant pain once per day.  Remove and discard patch within 12 hours of application. Patient not taking: Reported on 11/10/2021 01/14/21   Petrucelli, Glynda Jaeger, PA-C  tamsulosin (FLOMAX) 0.4 MG CAPS capsule Take 1 capsule (0.4 mg total) by mouth at bedtime. 11/10/21   Isla Pence, MD  traMADol (ULTRAM) 50 MG tablet Take 1-2 tablets (50-100 mg total) by mouth every 6 (six) hours as needed for moderate pain or severe pain. Patient not taking: Reported on 01/14/2021 08/02/20   Debbrah Alar, PA-C      Allergies    Patient has no known allergies.    Review of Systems   Review of Systems  Cardiovascular:  Positive for chest pain.  Genitourinary:  Positive for flank pain and frequency.  Neurological:  Positive for weakness.  All other systems reviewed and are negative.  Physical Exam Updated Vital Signs BP (!) 141/76    Pulse 63    Temp 97.6 F (36.4 C) (Oral)    Resp 18    SpO2 97%  Physical Exam Vitals and nursing note reviewed.  Constitutional:      Appearance: Normal appearance.  HENT:     Head: Normocephalic and atraumatic.  Right Ear: External ear normal.     Left Ear: External ear normal.     Nose: Nose normal.     Mouth/Throat:     Mouth: Mucous membranes are moist.     Pharynx: Oropharynx is clear.  Eyes:     Extraocular Movements: Extraocular movements intact.     Conjunctiva/sclera: Conjunctivae normal.     Pupils: Pupils are equal, round, and reactive to light.  Cardiovascular:     Rate and Rhythm: Normal rate and regular rhythm.     Pulses: Normal pulses.     Heart sounds: Normal heart sounds.  Pulmonary:     Effort: Pulmonary effort is normal.     Breath sounds: Normal breath sounds.  Abdominal:     General: Abdomen is flat. Bowel sounds are normal.     Palpations: Abdomen is soft.  Musculoskeletal:        General: Normal range of motion.      Cervical back: Normal range of motion and neck supple.  Skin:    General: Skin is warm.     Capillary Refill: Capillary refill takes less than 2 seconds.  Neurological:     General: No focal deficit present.     Mental Status: He is alert and oriented to person, place, and time.  Psychiatric:        Mood and Affect: Mood normal.        Behavior: Behavior normal.    ED Results / Procedures / Treatments   Labs (all labs ordered are listed, but only abnormal results are displayed) Labs Reviewed  BASIC METABOLIC PANEL - Abnormal; Notable for the following components:      Result Value   Glucose, Bld 103 (*)    BUN 47 (*)    Creatinine, Ser 2.94 (*)    GFR, Estimated 21 (*)    All other components within normal limits  CBC - Abnormal; Notable for the following components:   RBC 4.02 (*)    Hemoglobin 12.3 (*)    HCT 38.6 (*)    All other components within normal limits  RESP PANEL BY RT-PCR (FLU A&B, COVID) [id number]  HEPATIC FUNCTION PANEL  URINALYSIS, ROUTINE W REFLEX MICROSCOPIC  TROPONIN I (HIGH SENSITIVITY)  TROPONIN I (HIGH SENSITIVITY)  TROPONIN I (HIGH SENSITIVITY)    EKG EKG Interpretation  Date/Time:  Monday November 10 2021 08:27:57 EST Ventricular Rate:  94 PR Interval:  193 QRS Duration: 83 QT Interval:  390 QTC Calculation: 488 R Axis:   19 Text Interpretation: Sinus rhythm Abnormal R-wave progression, early transition Borderline prolonged QT interval No significant change since last tracing Confirmed by Isla Pence [phone]) on 11/10/2021 9:46:56 AM  Radiology DG Chest 2 View  Result Date: 11/09/2021 CLINICAL DATA:  Chest pain. EXAM: CHEST - 2 VIEW COMPARISON:  01/14/2021 FINDINGS: Normal heart size. Stable mediastinal contours with mild aortic atherosclerosis and tortuosity. Minor streaky left lung base atelectasis or scarring. No acute airspace disease. No pleural effusion, pulmonary edema, or confluent airspace disease. No pneumothorax. No acute osseous  abnormalities are seen. IMPRESSION: Minor left lung base atelectasis or scarring. Electronically Signed   By: Keith Rake M.D.   On: 11/09/2021 17:28   CT Renal Stone Study  Result Date: 11/10/2021 CLINICAL DATA:  Flank pain, chest pain beginning yesterday, pressure, feeling like a muscle pulling; history kidney stones, GERD, hypertension EXAM: CT ABDOMEN AND PELVIS WITHOUT CONTRAST TECHNIQUE: Multidetector CT imaging of the abdomen and pelvis was performed following the standard protocol  without IV contrast. COMPARISON:  02/08/2020 FINDINGS: Lower chest: Lung bases clear Hepatobiliary: Dome of liver excluded. Gallbladder and liver otherwise normal appearance. Pancreas: Normal appearance Spleen: Small spleen, otherwise unremarkable Adrenals/Urinary Tract: BILATERAL renal cysts. Tiny nonobstructing calculus RIGHT kidney. Somewhat atrophic appearing LEFT kidney. No solid renal mass or hydronephrosis. No ureteral calcification or dilatation. Bladder unremarkable. Stomach/Bowel: A scattered stool throughout colon. Appendix surgically absent by history. Stomach distended by food debris. Large and small bowel loops unremarkable. Vascular/Lymphatic: Atherosclerotic calcification aorta and iliac arteries without aneurysm. No adenopathy. Reproductive: Minimal prostatic enlargement. Seminal vesicles unremarkable. BILATERAL hydroceles. Other: Small LEFT inguinal hernia containing fat. Tiny umbilical hernia containing fat. No free air or free fluid. Musculoskeletal: Scattered degenerative disc and facet disease changes lumbar spine. IMPRESSION: BILATERAL renal cysts with tiny nonobstructing RIGHT renal calculus. Atrophic appearing LEFT kidney. Small LEFT inguinal and tiny umbilical hernias containing fat. BILATERAL hydroceles. No acute intra-abdominal or intrapelvic abnormalities. Aortic Atherosclerosis (ICD10-I70.0). Electronically Signed   By: Lavonia Dana M.D.   On: 11/10/2021 09:05    Procedures Procedures     Medications Ordered in ED Medications  sodium chloride 0.9 % bolus 1,000 mL (1,000 mLs Intravenous New Bag/Given 11/10/21 0844)    ED Course/ Medical Decision Making/ A&P                           Medical Decision Making  Pt's cardiac eval is negative.  Pt had troponins yesterday and again today and they are normal.  Repeat ekg is nl.    Pt was concerned about another ureteral obstruction.  No evidence of that on CT scan.  Pt's kidney function is stable.  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  Pt is stable for d/c.  He is to return if worse.  F/u with pcp.        Final Clinical Impression(s) / ED Diagnoses Final diagnoses:  Atypical chest pain  Urinary frequency  Stage 3b chronic kidney disease ([place])    Rx / DC Orders ED Discharge Orders          Ordered    tamsulosin (FLOMAX) 0.4 MG CAPS capsule  Daily at bedtime        11/10/21 1129              Isla Pence, MD 11/10/21 1133

## 2022-01-27 IMAGING — DX DG ABDOMEN 1V
1 series · 1 of 1 positions shown · non-contrast
Comparison: CT abdomen and pelvis February 08, 2020

CLINICAL DATA: Ureteral stent placement

EXAM:
ABDOMEN - 1 VIEW

[abdomen kub]
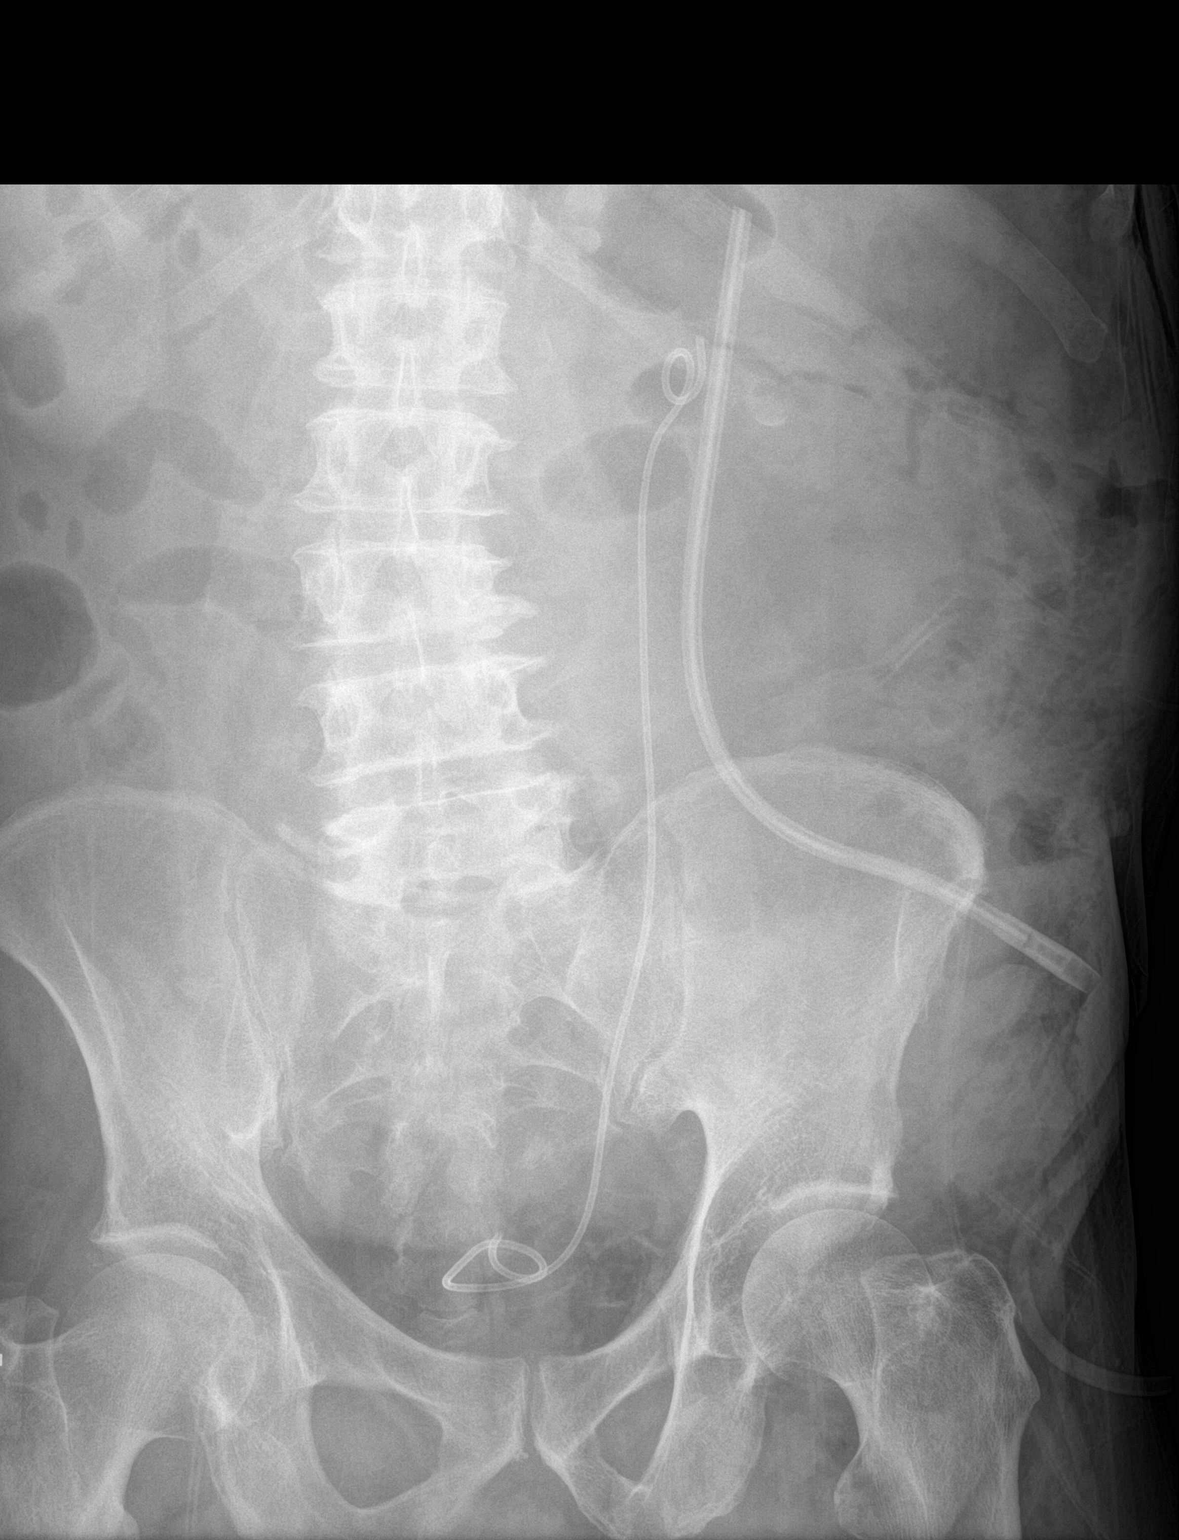

[1 of 1 positions shown; findings below may reference images not displayed]

FINDINGS: A double-J stent has been placed on the left from the level of L1 to
the level of the bladder. There is an apparent drain lateral to the
double-J stent. There is an ill-defined calcification lateral to the
drain on the left measuring 1.3 x 1.3 cm. Exact location of this
calcification is uncertain. There is no bowel dilatation or
air-fluid level to suggest bowel obstruction. No free air evident on
supine examination.
IMPRESSION: Double-J stent on the left extends from L1 the bladder. Apparent
drain lateral to the stent on the left. Ill-defined 1.3 cm
calcification noted lateral to the stent on the left, uncertain
location.

No bowel obstruction or free air evident.

## 2022-02-02 DIAGNOSIS — N411 Chronic prostatitis: Secondary | ICD-10-CM | POA: Diagnosis not present

## 2022-02-02 DIAGNOSIS — R3915 Urgency of urination: Secondary | ICD-10-CM | POA: Diagnosis not present

## 2022-02-10 DIAGNOSIS — I10 Essential (primary) hypertension: Secondary | ICD-10-CM | POA: Diagnosis not present

## 2022-02-10 DIAGNOSIS — N189 Chronic kidney disease, unspecified: Secondary | ICD-10-CM | POA: Diagnosis not present

## 2022-02-10 DIAGNOSIS — K2100 Gastro-esophageal reflux disease with esophagitis, without bleeding: Secondary | ICD-10-CM | POA: Diagnosis not present

## 2022-02-10 DIAGNOSIS — Z0000 Encounter for general adult medical examination without abnormal findings: Secondary | ICD-10-CM | POA: Diagnosis not present

## 2022-02-10 DIAGNOSIS — Z6829 Body mass index (BMI) 29.0-29.9, adult: Secondary | ICD-10-CM | POA: Diagnosis not present

## 2022-02-10 DIAGNOSIS — E7849 Other hyperlipidemia: Secondary | ICD-10-CM | POA: Diagnosis not present

## 2022-02-23 DIAGNOSIS — N3091 Cystitis, unspecified with hematuria: Secondary | ICD-10-CM | POA: Diagnosis not present

## 2022-02-23 DIAGNOSIS — Z6828 Body mass index (BMI) 28.0-28.9, adult: Secondary | ICD-10-CM | POA: Diagnosis not present

## 2022-03-19 DIAGNOSIS — H02829 Cysts of unspecified eye, unspecified eyelid: Secondary | ICD-10-CM | POA: Diagnosis not present

## 2022-03-23 DIAGNOSIS — N411 Chronic prostatitis: Secondary | ICD-10-CM | POA: Diagnosis not present

## 2022-04-15 DIAGNOSIS — J411 Mucopurulent chronic bronchitis: Secondary | ICD-10-CM | POA: Diagnosis not present

## 2022-04-15 DIAGNOSIS — Z6828 Body mass index (BMI) 28.0-28.9, adult: Secondary | ICD-10-CM | POA: Diagnosis not present

## 2022-05-05 DIAGNOSIS — M25561 Pain in right knee: Secondary | ICD-10-CM | POA: Diagnosis not present

## 2022-05-05 DIAGNOSIS — S8391XA Sprain of unspecified site of right knee, initial encounter: Secondary | ICD-10-CM | POA: Diagnosis not present

## 2022-05-08 ENCOUNTER — Ambulatory Visit: Payer: Medicare PPO | Admitting: Physician Assistant

## 2022-05-08 ENCOUNTER — Ambulatory Visit (INDEPENDENT_AMBULATORY_CARE_PROVIDER_SITE_OTHER): Payer: Medicare PPO

## 2022-05-08 DIAGNOSIS — M25561 Pain in right knee: Secondary | ICD-10-CM | POA: Diagnosis not present

## 2022-05-08 DIAGNOSIS — S82141A Displaced bicondylar fracture of right tibia, initial encounter for closed fracture: Secondary | ICD-10-CM | POA: Diagnosis not present

## 2022-05-08 NOTE — Progress Notes (Signed)
Office Visit Note   Patient: Joshua Farmer           Date of Birth: [birth year]           MRN: [id number] Visit Date: 05/08/2022              Requested by: Neale Burly, MD [place],  [place] [id number] PCP: Neale Burly, MD  Chief Complaint  Patient presents with   Right Knee - Pain      HPI: Mr. Joshua Farmer is a very pleasant 81 year old gentleman who is not quite 2 weeks status post falling off a ladder while trimming some bushes.  He landed directly on his right leg.  He was unable to ambulate.  He was taken to a local emergency room where he lives in [place].  He was told he had a crack in the bone.  He complains of pain over the medial and lateral tibial plateau.  Does not complain of any joint pain no distal femur plain.  He had x-rays there and was told he had a crack in the bone of the tibia.  He also had ankle x-rays which per he and his wife were negative.  He has been on crutches since and told to follow-up with orthopedics.  Assessment & Plan: Visit Diagnoses:  1. Acute pain of right knee     Plan: Given the mechanism of injury and his pain I cannot completely rule out a tibial plateau fracture.  If so I think this is nondisplaced.  I will go forward and order a CT scan of the right knee.  Once this is obtained I will arrange for follow-up with him perhaps with Dr. Lorin Mercy who goes [facility] on a regular basis.  I also would like for him to take an aspirin a day which he used to do and just stopped about a year ago.  He should continue to be nonweightbearing we will provide him with a knee immobilizer.  Would also like him to bend and do ankle pumps  Follow-Up Instructions: After CT scan  Ortho Exam  Patient is alert, oriented, no adenopathy, well-dressed, normal affect, normal respiratory effort. Examination of the right knee he does have a mild effusion in the knee.  He has no redness no cellulitis the compartments of his lower leg although he does have some  swelling going down to his ankle and foot are soft nontender negative Homans' sign.  Sensation is intact.  He does have acute tenderness over the lateral tibial plateau.  Not tender over the proximal fibula some tenderness over the medial tibial plateau no tenderness over the medial lateral joint line or the patellofemoral joint Imaging: No results found. No images are attached to the encounter.  Labs: Lab Results  Component Value Date   REPTSTATUS 07/27/2020 FINAL 07/26/2020   CULT (A) 07/26/2020    <10,000 COLONIES/mL INSIGNIFICANT GROWTH Performed at [facility] [address], [place], [place] [address]      Lab Results  Component Value Date   ALBUMIN 3.5 11/10/2021   ALBUMIN 3.5 01/14/2021   ALBUMIN 4.0 07/26/2020    No results found for: "MG" No results found for: "VD25OH"  No results found for: "PREALBUMIN"    Latest Ref Rng & Units 11/09/2021    4:30 PM 01/14/2021    7:12 PM 08/03/2020    4:38 AM  CBC EXTENDED  WBC 4.0 - 10.5 K/uL 8.3  7.9  RBC 4.22 - 5.81 MIL/uL 4.02  3.83    Hemoglobin 13.0 - 17.0 g/dL 12.3  11.9  10.3   HCT 39.0 - 52.0 % 38.6  37.2  32.2   Platelets 150 - 400 K/uL 341  341    NEUT# 1.7 - 7.7 K/uL  4.7    Lymph# 0.7 - 4.0 K/uL  2.1       There is no height or weight on file to calculate BMI.  Orders:  Orders Placed This Encounter  Procedures   XR Knee 1-2 Views Right   CT KNEE RIGHT WO CONTRAST   No orders of the defined types were placed in this encounter.    Procedures: No procedures performed  Clinical Data: No additional findings.  ROS:  All other systems negative, except as noted in the HPI. Review of Systems  Objective: Vital Signs: There were no vitals taken for this visit.  Specialty Comments:  No specialty comments available.  PMFS History: Patient Active Problem List   Diagnosis Date Noted   UPJ (ureteropelvic junction) obstruction 08/02/2020   Hypertension    High cholesterol    Gastroenteritis     Past Medical History:  Diagnosis Date   Gastroenteritis    GERD (gastroesophageal reflux disease)    High cholesterol    History of kidney stones 2001   Hypertension     No family history on file.  Past Surgical History:  Procedure Laterality Date   APPENDECTOMY     CYSTOSCOPY W/ URETERAL STENT PLACEMENT Left 03/25/2020   Procedure: CYSTOSCOPY WITH RETROGRADE PYELOGRAM/URETERAL STENT PLACEMENT;  Surgeon: Franchot Gallo, MD;  Location: [facility];  Service: Urology;  Laterality: Left;  [place] PYELOPLASTY Left 08/02/2020   Procedure: XI ROBOTIC ASSISTED LEFT PYELOPLASTY WITH STENT EXCHANGE;  Surgeon: Ardis Hughs, MD;  Location: [facility];  Service: Urology;  Laterality: Left;   Social History   Occupational History   Not on file  Tobacco Use   Smoking status: Never   Smokeless tobacco: Never  Vaping Use   Vaping Use: Never used  Substance and Sexual Activity   Alcohol use: No   Drug use: No   Sexual activity: Not on file

## 2022-05-08 NOTE — Progress Notes (Signed)
xr

## 2022-05-12 DIAGNOSIS — K219 Gastro-esophageal reflux disease without esophagitis: Secondary | ICD-10-CM | POA: Diagnosis not present

## 2022-05-12 DIAGNOSIS — M810 Age-related osteoporosis without current pathological fracture: Secondary | ICD-10-CM | POA: Diagnosis not present

## 2022-05-12 DIAGNOSIS — I10 Essential (primary) hypertension: Secondary | ICD-10-CM | POA: Diagnosis not present

## 2022-05-12 DIAGNOSIS — Z6829 Body mass index (BMI) 29.0-29.9, adult: Secondary | ICD-10-CM | POA: Diagnosis not present

## 2022-05-12 DIAGNOSIS — M25561 Pain in right knee: Secondary | ICD-10-CM | POA: Diagnosis not present

## 2022-05-13 ENCOUNTER — Ambulatory Visit (HOSPITAL_COMMUNITY)
Admission: RE | Admit: 2022-05-13 | Discharge: 2022-05-13 | Disposition: A | Discharge status: Home / Self Care | Payer: Medicare PPO | Source: Ambulatory Visit | Attending: Physician Assistant | Admitting: Physician Assistant

## 2022-05-13 DIAGNOSIS — M7989 Other specified soft tissue disorders: Secondary | ICD-10-CM | POA: Diagnosis not present

## 2022-05-13 DIAGNOSIS — M25561 Pain in right knee: Secondary | ICD-10-CM

## 2022-05-15 ENCOUNTER — Ambulatory Visit: Payer: Medicare PPO | Admitting: Physician Assistant

## 2022-05-15 ENCOUNTER — Encounter: Payer: Self-pay | Admitting: Physician Assistant

## 2022-05-15 ENCOUNTER — Other Ambulatory Visit: Payer: Self-pay

## 2022-05-15 ENCOUNTER — Ambulatory Visit (HOSPITAL_COMMUNITY)
Admission: RE | Admit: 2022-05-15 | Discharge: 2022-05-15 | Disposition: A | Discharge status: Home / Self Care | Payer: Medicare PPO | Source: Ambulatory Visit | Attending: Physician Assistant | Admitting: Physician Assistant

## 2022-05-15 ENCOUNTER — Other Ambulatory Visit: Payer: Self-pay | Admitting: Physician Assistant

## 2022-05-15 ENCOUNTER — Telehealth: Payer: Self-pay

## 2022-05-15 DIAGNOSIS — M25561 Pain in right knee: Secondary | ICD-10-CM | POA: Diagnosis not present

## 2022-05-15 MED ORDER — HYDROCODONE-ACETAMINOPHEN 5-325 MG PO TABS: 1.0000 | ORAL_TABLET | Freq: Four times a day (QID) | ORAL | 0 refills | Status: DC | PRN

## 2022-05-15 MED ORDER — RIVAROXABAN (XARELTO) VTE STARTER PACK (15 & 20 MG): ORAL_TABLET | ORAL | 0 refills | Status: DC

## 2022-05-15 NOTE — Progress Notes (Addendum)
[facility]   Office Visit Note   Patient: Joshua Farmer           Date of Birth: 08/03/1941           MRN: [id number] Visit Date: 05/15/2022              Requested by: Neale Burly, MD [place],  [place] [id number] PCP: Neale Burly, MD  Chief Complaint  Patient presents with   Right Knee - Follow-up      HPI: Patient is a pleasant 81 year old gentleman who is now 2 weeks s/p falling off a ladder.  He was seen and evaluated in the emergency urgent care both his ankle and knee did not see any acute fractures.  Because of pain he had over the proximal tibia I did order a CT scan he is here to review it today.  He has been compliant with taking his aspirin daily.  He comes in today only using 1 crutch because he says the other crutches "in the car "he could not tolerate the knee immobilizer.  He is complaining over the last couple days that he has had some pain in the back of his calf.  Denies any fever chills or shortness of breath  Assessment & Plan: Visit Diagnoses:  1. Acute pain of right knee     Plan CT scan was reviewed today.  It does show a nondisplaced fracture of the posterior lip of the lateral tibial plateau.  We will place him in a knee brace so it makes it easier for him to nonweightbear.  He may do some range of motion at this point.  I am concerned with this development of calf pain in the last couple days.  I would like him to go today for an ultrasound to rule out DVT.  He and his wife are elderly and they live in [place].  They are much closer to our office in [place].  We will have him follow-up in 2 weeks with Dr. Lorin Mercy.  Patient also requesting something for pain.  I have ordered some hydrocodone which she should take sparingly.  Patient has been given return precautions with any shortness of breath fever chills increased pain I did receive a call today patient was positive for DVT.  I did call in a starter packet of Xarelto for him.  He will call his  primary care physician for follow-up on this.  He understands if he gets any type of shortness of breath chest pain or not feeling well he is to go to the emergency room immediately.was told to stop aspirin while on the xarelto  Follow-Up Instructions: Return in about 2 weeks (around 05/29/2022).   Ortho Exam  Patient is alert, oriented, no adenopathy, well-dressed, normal affect, normal respiratory effort. Examination of his right lower extremity.  No redness of cellulitis.  He has mild to moderate soft tissue swelling tender over the lateral and medial tibia.  He does have some tenderness over the posterior calf.  Positive pain with passive stretch.  No evidence of any infective process.  Sensation is intact.  Imaging: No results found. No images are attached to the encounter.  Labs: Lab Results  Component Value Date   REPTSTATUS 07/27/2020 FINAL 07/26/2020   CULT (A) 07/26/2020    <10,000 COLONIES/mL INSIGNIFICANT GROWTH Performed at [facility] [address], [place], [place] [address]      Lab Results  Component Value Date  ALBUMIN 3.5 11/10/2021   ALBUMIN 3.5 01/14/2021   ALBUMIN 4.0 07/26/2020    No results found for: "MG" No results found for: "VD25OH"  No results found for: "PREALBUMIN"    Latest Ref Rng & Units 11/09/2021    4:30 PM 01/14/2021    7:12 PM 08/03/2020    4:38 AM  CBC EXTENDED  WBC 4.0 - 10.5 K/uL 8.3  7.9    RBC 4.22 - 5.81 MIL/uL 4.02  3.83    Hemoglobin 13.0 - 17.0 g/dL 12.3  11.9  10.3   HCT 39.0 - 52.0 % 38.6  37.2  32.2   Platelets 150 - 400 K/uL 341  341    NEUT# 1.7 - 7.7 K/uL  4.7    Lymph# 0.7 - 4.0 K/uL  2.1       There is no height or weight on file to calculate BMI.  Orders:  Orders Placed This Encounter  Procedures   VAS [place] LOWER EXTREMITY VENOUS (DVT)   No orders of the defined types were placed in this encounter.    Procedures: No procedures performed  Clinical Data: No additional findings.  ROS:  All  other systems negative, except as noted in the HPI. Review of Systems  Objective: Vital Signs: There were no vitals taken for this visit.  Specialty Comments:  No specialty comments available.  PMFS History: Patient Active Problem List   Diagnosis Date Noted   UPJ (ureteropelvic junction) obstruction 08/02/2020   Hypertension    High cholesterol    Gastroenteritis    Past Medical History:  Diagnosis Date   Gastroenteritis    GERD (gastroesophageal reflux disease)    High cholesterol    History of kidney stones 2001   Hypertension     History reviewed. No pertinent family history.  Past Surgical History:  Procedure Laterality Date   APPENDECTOMY     CYSTOSCOPY W/ URETERAL STENT PLACEMENT Left 03/25/2020   Procedure: CYSTOSCOPY WITH RETROGRADE PYELOGRAM/URETERAL STENT PLACEMENT;  Surgeon: Franchot Gallo, MD;  Location: [facility];  Service: Urology;  Laterality: Left;  [place] PYELOPLASTY Left 08/02/2020   Procedure: XI ROBOTIC ASSISTED LEFT PYELOPLASTY WITH STENT EXCHANGE;  Surgeon: Ardis Hughs, MD;  Location: [facility];  Service: Urology;  Laterality: Left;   Social History   Occupational History   Not on file  Tobacco Use   Smoking status: Never   Smokeless tobacco: Never  Vaping Use   Vaping Use: Never used  Substance and Sexual Activity   Alcohol use: No   Drug use: No   Sexual activity: Not on file

## 2022-05-15 NOTE — Telephone Encounter (Signed)
POSITIVE for DVT

## 2022-05-15 NOTE — Progress Notes (Signed)
Right lower extremity venous  has been completed. Refer to [facility] under chart review to view preliminary results. Results given to [place] at [facility].  05/15/2022  2:50 PM Hassen Bruun, Bonnye Fava

## 2022-05-15 NOTE — Addendum Note (Signed)
Addended by: Georgette Dover on: 05/15/2022 03:11 PM   Modules accepted: Orders

## 2022-05-22 DIAGNOSIS — Z7901 Long term (current) use of anticoagulants: Secondary | ICD-10-CM | POA: Diagnosis not present

## 2022-05-28 ENCOUNTER — Ambulatory Visit: Payer: Medicare PPO | Admitting: Orthopaedic Surgery

## 2022-05-28 DIAGNOSIS — S82141A Displaced bicondylar fracture of right tibia, initial encounter for closed fracture: Secondary | ICD-10-CM | POA: Diagnosis not present

## 2022-05-28 NOTE — Progress Notes (Signed)
Office Visit Note   Patient: Joshua Farmer           Date of Birth: 07-30-1941           MRN: [id number] Visit Date: 05/28/2022              Requested by: Neale Burly, MD [place],  [place] [address] PCP: Neale Burly, MD   Assessment & Plan: Visit Diagnoses: Tibial plateau fracture, right.  Plan: Continue Coumadin for his DVT and popliteal DVT.  Return in 8 weeks repeat x-rays of his right knee.  We discussed fall prevention protected weightbearing.  Reviewed Doppler test DVT appropriate treatment.  Reviewed CT scan and conservative treatment of his tibial plateau fracture.  Repeat x-rays on return in 8 weeks.  With his renal function he needs to avoid anti-inflammatories which we discussed.  Follow-Up Instructions: Return in about 8 weeks (around 07/23/2022).   Orders:  No orders of the defined types were placed in this encounter.  No orders of the defined types were placed in this encounter.     Procedures: No procedures performed   Clinical Data: No additional findings.   Subjective: Chief Complaint  Patient presents with   Right Knee - Follow-up, Pain    Fell from ladder 05/05/22, DVT 05/15/22 still having calf pain.    HPI 81 year old male fell off a ladder on 7//23 injuring his right knee.  CT scan ordered by Larrie Kass Persons PA on 05/15/2022 showed nondisplaced fracture of posterior left lateral tibial plateau otherwise no abnormalities.  Patient's been wearing a knee brace.  Had swelling in his leg and Doppler test showed a DVT noted on 05/15/2022 and he was started on Coumadin.  DVT was in the right peroneal vein as well as popliteal vein and a cystic structure was noted in the popliteal region.  No DVT of the left lower extremity.  His PCP placed him on Coumadin.  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   Objective: Vital Signs: There were no vitals taken for this visit.  Physical  Exam Constitutional:      Appearance: He is well-developed.  HENT:     Head: Normocephalic and atraumatic.     Right Ear: External ear normal.     Left Ear: External ear normal.  Eyes:     Pupils: Pupils are equal, round, and reactive to light.  Neck:     Thyroid: No thyromegaly.     Trachea: No tracheal deviation.  Cardiovascular:     Rate and Rhythm: Normal rate.  Pulmonary:     Effort: Pulmonary effort is normal.     Breath sounds: No wheezing.  Abdominal:     General: Bowel sounds are normal.     Palpations: Abdomen is soft.  Musculoskeletal:     Cervical back: Neck supple.  Skin:    General: Skin is warm and dry.     Capillary Refill: Capillary refill takes less than 2 seconds.  Neurological:     Mental Status: He is alert and oriented to person, place, and time.  Psychiatric:        Behavior: Behavior normal.        Thought Content: Thought content normal.        Judgment: Judgment normal.     Ortho Exam patient has Tenderness.  Some tenderness posterior lateral joint line.  Mild knee is effusion.  Patient is in a removable  knee brace with Velcro.  Specialty Comments:  No specialty comments available.  Imaging: Narrative & Impression  CLINICAL DATA:  Right knee pain and swelling after a fall from a ladder 05/05/2022.   EXAM: CT OF THE RIGHT KNEE WITHOUT CONTRAST   TECHNIQUE: Multidetector CT imaging of the right knee was performed according to the standard protocol. Multiplanar CT image reconstructions were also generated.   RADIATION DOSE REDUCTION: This exam was performed according to the departmental dose-optimization program which includes automated exposure control, adjustment of the mA and/or kV according to patient size and/or use of iterative reconstruction technique.   COMPARISON:  Plain films right knee 05/08/2022.   FINDINGS: Bones/Joint/Cartilage   The patient has a nondisplaced fracture of the posterior lip of the lateral tibial  plateau which is best seen on images 39-43 of series 8. No other fracture is identified. Joint spaces and alignment are maintained. Very small joint effusion is noted.   Ligaments   Suboptimally assessed by CT.   Muscles and Tendons   Appear normal.   Soft tissues   Negative.   IMPRESSION: The examination is positive for a nondisplaced fracture of the posterior lip of the lateral tibial plateau. No other acute abnormality is identified.     Electronically Signed   By: Inge Rise M.D.   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  Past Surgical History:  Procedure Laterality Date   APPENDECTOMY     CYSTOSCOPY W/ URETERAL STENT PLACEMENT Left 03/25/2020   Procedure: CYSTOSCOPY WITH RETROGRADE PYELOGRAM/URETERAL STENT PLACEMENT;  Surgeon: Franchot Gallo, MD;  Location: [facility];  Service: Urology;  Laterality: Left;  [place] PYELOPLASTY Left 08/02/2020   Procedure: XI ROBOTIC ASSISTED LEFT PYELOPLASTY WITH STENT EXCHANGE;  Surgeon: Ardis Hughs, MD;  Location: [facility];  Service: Urology;  Laterality: Left;   Social History   Occupational History   Not on file  Tobacco Use   Smoking status: Never   Smokeless tobacco: Never  Vaping Use   Vaping Use: Never used  Substance and Sexual Activity   Alcohol use: No   Drug use: No   Sexual activity: Not on file

## 2022-06-01 DIAGNOSIS — S82141A Displaced bicondylar fracture of right tibia, initial encounter for closed fracture: Secondary | ICD-10-CM | POA: Insufficient documentation

## 2022-06-04 DIAGNOSIS — Z7901 Long term (current) use of anticoagulants: Secondary | ICD-10-CM | POA: Diagnosis not present

## 2022-06-11 ENCOUNTER — Ambulatory Visit: Payer: Medicare PPO | Admitting: Physician Assistant

## 2022-06-11 ENCOUNTER — Ambulatory Visit (INDEPENDENT_AMBULATORY_CARE_PROVIDER_SITE_OTHER): Payer: Medicare PPO

## 2022-06-11 ENCOUNTER — Encounter: Payer: Self-pay | Admitting: Physician Assistant

## 2022-06-11 DIAGNOSIS — S82141A Displaced bicondylar fracture of right tibia, initial encounter for closed fracture: Secondary | ICD-10-CM | POA: Diagnosis not present

## 2022-06-11 NOTE — Progress Notes (Signed)
Office Visit Note   Patient: Joshua Farmer           Date of Birth: [birth year]           MRN: [id number] Visit Date: 06/11/2022              Requested by: Neale Burly, MD [place],  [place] [address] PCP: Neale Burly, MD  Chief Complaint  Patient presents with   Right Knee - Follow-up      HPI: Mr. Kaigler is a pleasant 81 year old gentleman who is 7 weeks status post right posterior lateral tibial plateau fracture that was minimally displaced.  He is overall doing well.  He is using crutches as he needs it and is using his brace.  He does complain of some pain medially in his knee.  His primary care physician is managing his DVT with Coumadin  Assessment & Plan: Visit Diagnoses:  1. 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  Because of the recent fracture I would not go forward with giving him any cortisone injections today.  Did discuss topical Voltaren gel.  He does not have any pain to palpation over the area of the fracture.  He may slowly progress weightbearing as tolerated.  Should still continue to use the brace for a couple months.  Would discourage him from doing any at risk activities such as climbing up on a ladder  Follow-Up Instructions: 4 weeks  Ortho Exam  Patient is alert, oriented, no adenopathy, well-dressed, normal affect, normal respiratory effort. He is ambulating with minimal assist from crutches.  His left knee he has no effusion no redness he has a soft calf negative Homans' sign.  He does have some tenderness over the medial joint line.  No tenderness to palpation over the posterior lateral joint line.  Distal sensation is intact  Imaging: XR Knee 1-2 Views Right  Result Date: 06/11/2022 2 view radiographs of his right knee were obtained today.  He has overall well-maintained alignment.  Do not see  evidence of any depression of the lateral plateau of the tibia.  He does have some degenerative changes with sclerosis and periarticular osteophyte formation on the medial compartment  No images are attached to the encounter.  Labs: Lab Results  Component Value Date   REPTSTATUS 07/27/2020 FINAL 07/26/2020   CULT (A) 07/26/2020    <10,000 COLONIES/mL INSIGNIFICANT GROWTH Performed at [facility] [address], [place], [place] [address]      Lab Results  Component Value Date   ALBUMIN 3.5 11/10/2021   ALBUMIN 3.5 01/14/2021   ALBUMIN 4.0 07/26/2020    No results found for: "MG" No results found for: "VD25OH"  No results found for: "PREALBUMIN"    Latest Ref Rng & Units 11/09/2021    4:30 PM 01/14/2021    7:12 PM 08/03/2020    4:38 AM  CBC EXTENDED  WBC 4.0 - 10.5 K/uL 8.3  7.9    RBC 4.22 - 5.81 MIL/uL 4.02  3.83    Hemoglobin 13.0 - 17.0 g/dL 12.3  11.9  10.3   HCT 39.0 - 52.0 % 38.6  37.2  32.2   Platelets 150 - 400 K/uL 341  341    NEUT# 1.7 - 7.7 K/uL  4.7    Lymph# 0.7 - 4.0  K/uL  2.1       There is no height or weight on file to calculate BMI.  Orders:  Orders Placed This Encounter  Procedures   XR Knee 1-2 Views Right   No orders of the defined types were placed in this encounter.    Procedures: No procedures performed  Clinical Data: No additional findings.  ROS:  All other systems negative, except as noted in the HPI. Review of Systems  Objective: Vital Signs: There were no vitals taken for this visit.  Specialty Comments:  No specialty comments available.  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 No pertinent family history.  Past Surgical History:  Procedure Laterality Date   APPENDECTOMY     CYSTOSCOPY W/ URETERAL STENT PLACEMENT Left 03/25/2020   Procedure: CYSTOSCOPY WITH RETROGRADE PYELOGRAM/URETERAL STENT PLACEMENT;  Surgeon: Franchot Gallo, MD;  Location: [facility];  Service: Urology;  Laterality: Left;  [place] PYELOPLASTY Left 08/02/2020   Procedure: XI ROBOTIC ASSISTED LEFT PYELOPLASTY WITH STENT EXCHANGE;  Surgeon: Ardis Hughs, MD;  Location: [facility];  Service: Urology;  Laterality: Left;   Social History   Occupational History   Not on file  Tobacco Use   Smoking status: Never   Smokeless tobacco: Never  Vaping Use   Vaping Use: Never used  Substance and Sexual Activity   Alcohol use: No   Drug use: No   Sexual activity: Not on file

## 2022-06-15 DIAGNOSIS — Z7901 Long term (current) use of anticoagulants: Secondary | ICD-10-CM | POA: Diagnosis not present

## 2022-06-18 ENCOUNTER — Ambulatory Visit: Payer: Medicare PPO | Admitting: Physician Assistant

## 2022-06-18 ENCOUNTER — Encounter: Payer: Self-pay | Admitting: Physician Assistant

## 2022-06-18 ENCOUNTER — Ambulatory Visit (INDEPENDENT_AMBULATORY_CARE_PROVIDER_SITE_OTHER): Payer: Medicare PPO

## 2022-06-18 DIAGNOSIS — M25571 Pain in right ankle and joints of right foot: Secondary | ICD-10-CM

## 2022-06-18 NOTE — Progress Notes (Signed)
Office Visit Note   Patient: Joshua Farmer           Date of Birth: 12/28/1940           MRN: [id number] Visit Date: 06/18/2022              Requested by: Neale Burly, MD [place],  [place] [address] PCP: Neale Burly, MD  Chief Complaint  Patient presents with   Right Ankle - Follow-up      HPI: Patient is a pleasant 81 year old gentleman who is 9 weeks status post nonoperative tibial plateau fracture.  At his last visit I allowed him to start putting some weight on his leg.  He comes in today because he has developed some swelling in his ankle.  He also has pain in his calf muscle when he turns it his ankle a certain way.  He says this feels different than when he was diagnosed with a DVT.  He is on Coumadin therapy P which is followed by his primary care provider and he does report that he has been told to stop it for a few days secondary to a high INR but will be starting it on the weekend.  Denies any fever chills  Assessment & Plan: Visit Diagnoses:  1. Pain in right ankle and joints of right foot     Plan: X-rays of his ankle were benign.  He does not have any pain on passive stretch of the ankle.  He does have some tenderness over the calf muscle with palpation.  He does admit this feels a lot better when he puts Voltaren gel on it which is reassuring.  He is already being treated for DVT with Coumadin therapy.  Given his high INR wondering if he had a bleed of the muscle.  I offered him an ultrasound but he has declined this at this time and would like to see how it does the next few days.  I will also begin ordering physical therapy up near his home in [place].  I gave him very specific return parameters and he is to contact us if anything changes  Follow-Up Instructions: 2 weeks  Ortho Exam  Patient is alert, oriented, no adenopathy, well-dressed, normal affect, normal respiratory effort. Examination of his right lower leg he has no  cellulitis no redness he does have some swelling in the right ankle.  He has good plantarflexion dorsiflexion inversion and eversion some of that active motion does recreate mild pain in his gastroc muscle.  With passive stress he does not have any pain at all.  Compartments are soft and compressible  Imaging: XR Ankle Complete Right  Result Date: 06/18/2022 Radiographs of his right ankle were obtained in multiple projections today.  He has well-maintained alignment through the mortise.  No evidence of fracture or other osseous injuries noted calcaneal spur probably secondary to plantar fasciitis  No images are attached to the encounter.  Labs: Lab Results  Component Value Date   REPTSTATUS 07/27/2020 FINAL 07/26/2020   CULT (A) 07/26/2020    <10,000 COLONIES/mL INSIGNIFICANT GROWTH Performed at [facility] [address], [place], [place] [address]      Lab Results  Component Value Date   ALBUMIN 3.5 11/10/2021   ALBUMIN 3.5 01/14/2021   ALBUMIN 4.0 07/26/2020    No results found for: "MG" No results found for: "VD25OH"  No results found for: "PREALBUMIN"    Latest Ref Rng &  Units 11/09/2021    4:30 PM 01/14/2021    7:12 PM 08/03/2020    4:38 AM  CBC EXTENDED  WBC 4.0 - 10.5 K/uL 8.3  7.9    RBC 4.22 - 5.81 MIL/uL 4.02  3.83    Hemoglobin 13.0 - 17.0 g/dL 12.3  11.9  10.3   HCT 39.0 - 52.0 % 38.6  37.2  32.2   Platelets 150 - 400 K/uL 341  341    NEUT# 1.7 - 7.7 K/uL  4.7    Lymph# 0.7 - 4.0 K/uL  2.1       There is no height or weight on file to calculate BMI.  Orders:  Orders Placed This Encounter  Procedures   XR Ankle Complete Right   No orders of the defined types were placed in this encounter.    Procedures: No procedures performed  Clinical Data: No additional findings.  ROS:  All other systems negative, except as noted in the HPI. Review of Systems  Objective: Vital Signs: There were no vitals taken for this visit.  Specialty  Comments:  No specialty comments available.  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 No pertinent family history.  Past Surgical History:  Procedure Laterality Date   APPENDECTOMY     CYSTOSCOPY W/ URETERAL STENT PLACEMENT Left 03/25/2020   Procedure: CYSTOSCOPY WITH RETROGRADE PYELOGRAM/URETERAL STENT PLACEMENT;  Surgeon: Franchot Gallo, MD;  Location: [facility];  Service: Urology;  Laterality: Left;  [place] PYELOPLASTY Left 08/02/2020   Procedure: XI ROBOTIC ASSISTED LEFT PYELOPLASTY WITH STENT EXCHANGE;  Surgeon: Ardis Hughs, MD;  Location: [facility];  Service: Urology;  Laterality: Left;   Social History   Occupational History   Not on file  Tobacco Use   Smoking status: Never   Smokeless tobacco: Never  Vaping Use   Vaping Use: Never used  Substance and Sexual Activity   Alcohol use: No   Drug use: No   Sexual activity: Not on file

## 2022-06-22 ENCOUNTER — Other Ambulatory Visit: Payer: Self-pay

## 2022-06-22 DIAGNOSIS — S82141A Displaced bicondylar fracture of right tibia, initial encounter for closed fracture: Secondary | ICD-10-CM

## 2022-06-25 ENCOUNTER — Telehealth: Payer: Self-pay | Admitting: Physician Assistant

## 2022-06-25 NOTE — Telephone Encounter (Signed)
Pt called requesting a referral be sent to [facility] for pt to get ultrasound to see if he had blood clots in his legs. Please call pt about this matter at [phone].

## 2022-06-26 ENCOUNTER — Telehealth: Payer: Self-pay | Admitting: Physician Assistant

## 2022-06-26 DIAGNOSIS — S82141A Displaced bicondylar fracture of right tibia, initial encounter for closed fracture: Secondary | ICD-10-CM

## 2022-06-26 NOTE — Telephone Encounter (Signed)
Patient called back. Says it is the R leg he would like the ultrasound done on.

## 2022-06-26 NOTE — Telephone Encounter (Signed)
Attempted to contact patient however had to leave a voicemail in regards to which leg he was requesting to have ultrasound of.

## 2022-06-29 ENCOUNTER — Other Ambulatory Visit: Payer: Self-pay

## 2022-06-29 NOTE — Telephone Encounter (Signed)
Ultrasound of right leg has been ordered as patient has requested.

## 2022-07-03 ENCOUNTER — Telehealth: Payer: Self-pay

## 2022-07-03 DIAGNOSIS — Z7901 Long term (current) use of anticoagulants: Secondary | ICD-10-CM | POA: Diagnosis not present

## 2022-07-03 NOTE — Telephone Encounter (Signed)
I called patient to see if he went to his PCP this week to have them check his legs (see last phone call note). The venous doppler he requested earlier in the week has not been scheduled, unfortunately (he does have known blood clots and is on treatment for this). Bevely Palmer Persons, PA is aware of this.We need to know if he still wants to have this done or do we cancel the order. I tried the home number and got his wife -- she said to call his cell -- reached his voice mail. Asked him to call me back regarding this, so that we know how to proceed.

## 2022-07-07 DIAGNOSIS — R531 Weakness: Secondary | ICD-10-CM | POA: Diagnosis not present

## 2022-07-07 DIAGNOSIS — M25561 Pain in right knee: Secondary | ICD-10-CM | POA: Diagnosis not present

## 2022-07-07 DIAGNOSIS — R2689 Other abnormalities of gait and mobility: Secondary | ICD-10-CM | POA: Diagnosis not present

## 2022-07-10 DIAGNOSIS — M25561 Pain in right knee: Secondary | ICD-10-CM | POA: Diagnosis not present

## 2022-07-10 DIAGNOSIS — R2689 Other abnormalities of gait and mobility: Secondary | ICD-10-CM | POA: Diagnosis not present

## 2022-07-10 DIAGNOSIS — R531 Weakness: Secondary | ICD-10-CM | POA: Diagnosis not present

## 2022-07-14 DIAGNOSIS — R531 Weakness: Secondary | ICD-10-CM | POA: Diagnosis not present

## 2022-07-14 DIAGNOSIS — R2689 Other abnormalities of gait and mobility: Secondary | ICD-10-CM | POA: Diagnosis not present

## 2022-07-14 DIAGNOSIS — M25561 Pain in right knee: Secondary | ICD-10-CM | POA: Diagnosis not present

## 2022-07-16 ENCOUNTER — Ambulatory Visit (HOSPITAL_COMMUNITY)
Admission: RE | Admit: 2022-07-16 | Discharge: 2022-07-16 | Disposition: A | Discharge status: Home / Self Care | Payer: Medicare PPO | Source: Ambulatory Visit | Attending: Orthopaedic Surgery | Admitting: Orthopaedic Surgery

## 2022-07-16 ENCOUNTER — Encounter: Payer: Self-pay | Admitting: Orthopaedic Surgery

## 2022-07-16 ENCOUNTER — Ambulatory Visit (INDEPENDENT_AMBULATORY_CARE_PROVIDER_SITE_OTHER): Payer: Medicare PPO

## 2022-07-16 ENCOUNTER — Ambulatory Visit: Payer: Medicare PPO | Admitting: Orthopaedic Surgery

## 2022-07-16 VITALS — Ht 72.0 in | Wt 189.0 lb

## 2022-07-16 DIAGNOSIS — I82431 Acute embolism and thrombosis of right popliteal vein: Secondary | ICD-10-CM | POA: Diagnosis not present

## 2022-07-16 DIAGNOSIS — S82141A Displaced bicondylar fracture of right tibia, initial encounter for closed fracture: Secondary | ICD-10-CM

## 2022-07-16 DIAGNOSIS — Z86718 Personal history of other venous thrombosis and embolism: Secondary | ICD-10-CM | POA: Diagnosis not present

## 2022-07-16 NOTE — Progress Notes (Signed)
Office Visit Note   Patient: Joshua Farmer           Date of Birth: February 02, 1941           MRN: [id number] Visit Date: 07/16/2022              Requested by: Neale Burly, MD [place],  [place] [address] PCP: Neale Burly, MD   Assessment & Plan: Visit Diagnoses:  1. Closed fracture of right tibial plateau, initial encounter   2. Acute deep vein thrombosis (DVT) of popliteal vein of right lower extremity (HCC)     Plan: We will obtain new venous Doppler check his DVT since has been on Coumadin for 2 months.  I can call him with the results.  Continue knee sleeve continue therapy is walking better happy with the results of therapy.  Follow-Up Instructions: No follow-ups on file.   Orders:  Orders Placed This Encounter  Procedures   XR Knee 1-2 Views Right   US Venous Img Lower Unilateral Right (DVT)   No orders of the defined types were placed in this encounter.     Procedures: No procedures performed   Clinical Data: No additional findings.   Subjective: Chief Complaint  Patient presents with   Right Knee - Follow-up, Fracture    Fall 05/05/2022    HPI 81 year old male returns post fall with tibial fracture and 10 days later DVT popliteal vein and peroneal vein right lower extremity.  They also noted likely Baker's cyst at that time.  Knee is better he is in therapy.  Had to stop his Coumadin since his pro time got elevated too much.  Venous Doppler was done on 05/15/2022.  Review of Systems updated unchanged no chest pain no PE no dyspnea.  All the systems noncontributory.   Objective: Vital Signs: Ht 6' (1.829 m)   Wt 189 lb (85.7 kg)   BMI 25.63 kg/m   Physical Exam Constitutional:      Appearance: He is well-developed.  HENT:     Head: Normocephalic and atraumatic.     Right Ear: External ear normal.     Left Ear: External ear normal.  Eyes:     Pupils: Pupils are equal, round, and reactive to light.  Neck:     Thyroid: No  thyromegaly.     Trachea: No tracheal deviation.  Cardiovascular:     Rate and Rhythm: Normal rate.  Pulmonary:     Effort: Pulmonary effort is normal.     Breath sounds: No wheezing.  Abdominal:     General: Bowel sounds are normal.     Palpations: Abdomen is soft.  Musculoskeletal:     Cervical back: Neck supple.  Skin:    General: Skin is warm and dry.     Capillary Refill: Capillary refill takes less than 2 seconds.  Neurological:     Mental Status: He is alert and oriented to person, place, and time.  Psychiatric:        Behavior: Behavior normal.        Thought Content: Thought content normal.        Judgment: Judgment normal.     Ortho Exam patient has knee brace with straps has been wearing a patellar roll.  He ambulates without it without significant limp there is trace effusion in the knee.  Possible small Baker's cyst palpable.  Collateral ligaments are stable no tenderness over the lateral tibial plateau.  Specialty Comments:  No  specialty comments available.  Imaging: XR Knee 1-2 Views Right  Result Date: 07/16/2022 X-rays right knee obtained and reviewed.  AP and lateral.  No depression lateral tibial plateau.  Slight callus posterior lip consistent where the CT scan showed posterior lip fracture lateral tibial plateau. Impression: Right knee lateral tibial plateau fracture healed.    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  Past Surgical History:  Procedure Laterality Date   APPENDECTOMY     CYSTOSCOPY W/ URETERAL STENT PLACEMENT Left 03/25/2020   Procedure: CYSTOSCOPY WITH  RETROGRADE PYELOGRAM/URETERAL STENT PLACEMENT;  Surgeon: Franchot Gallo, MD;  Location: [facility];  Service: Urology;  Laterality: Left;  [place] PYELOPLASTY Left 08/02/2020   Procedure: XI ROBOTIC ASSISTED LEFT PYELOPLASTY WITH STENT EXCHANGE;  Surgeon: Ardis Hughs, MD;  Location: [facility];  Service: Urology;  Laterality: Left;   Social History   Occupational History   Not on file  Tobacco Use   Smoking status: Never   Smokeless tobacco: Never  Vaping Use   Vaping Use: Never used  Substance and Sexual Activity   Alcohol use: No   Drug use: No   Sexual activity: Not on file

## 2022-07-16 NOTE — Addendum Note (Signed)
Addended by: Meyer Cory on: 07/16/2022 10:17 AM   Modules accepted: Orders

## 2022-07-17 DIAGNOSIS — M25561 Pain in right knee: Secondary | ICD-10-CM | POA: Diagnosis not present

## 2022-07-17 DIAGNOSIS — R2689 Other abnormalities of gait and mobility: Secondary | ICD-10-CM | POA: Diagnosis not present

## 2022-07-17 DIAGNOSIS — R531 Weakness: Secondary | ICD-10-CM | POA: Diagnosis not present

## 2022-07-21 DIAGNOSIS — M25561 Pain in right knee: Secondary | ICD-10-CM | POA: Diagnosis not present

## 2022-07-21 DIAGNOSIS — R531 Weakness: Secondary | ICD-10-CM | POA: Diagnosis not present

## 2022-07-21 DIAGNOSIS — R2689 Other abnormalities of gait and mobility: Secondary | ICD-10-CM | POA: Diagnosis not present

## 2022-07-23 DIAGNOSIS — R531 Weakness: Secondary | ICD-10-CM | POA: Diagnosis not present

## 2022-07-23 DIAGNOSIS — R2689 Other abnormalities of gait and mobility: Secondary | ICD-10-CM | POA: Diagnosis not present

## 2022-07-23 DIAGNOSIS — M25561 Pain in right knee: Secondary | ICD-10-CM | POA: Diagnosis not present

## 2022-07-30 DIAGNOSIS — R531 Weakness: Secondary | ICD-10-CM | POA: Diagnosis not present

## 2022-07-30 DIAGNOSIS — M25561 Pain in right knee: Secondary | ICD-10-CM | POA: Diagnosis not present

## 2022-07-30 DIAGNOSIS — R2689 Other abnormalities of gait and mobility: Secondary | ICD-10-CM | POA: Diagnosis not present

## 2022-08-04 DIAGNOSIS — R531 Weakness: Secondary | ICD-10-CM | POA: Diagnosis not present

## 2022-08-04 DIAGNOSIS — M25561 Pain in right knee: Secondary | ICD-10-CM | POA: Diagnosis not present

## 2022-08-04 DIAGNOSIS — R2689 Other abnormalities of gait and mobility: Secondary | ICD-10-CM | POA: Diagnosis not present

## 2022-08-11 DIAGNOSIS — R2689 Other abnormalities of gait and mobility: Secondary | ICD-10-CM | POA: Diagnosis not present

## 2022-08-11 DIAGNOSIS — M25561 Pain in right knee: Secondary | ICD-10-CM | POA: Diagnosis not present

## 2022-08-11 DIAGNOSIS — R531 Weakness: Secondary | ICD-10-CM | POA: Diagnosis not present

## 2022-08-12 DIAGNOSIS — Z125 Encounter for screening for malignant neoplasm of prostate: Secondary | ICD-10-CM | POA: Diagnosis not present

## 2022-08-12 DIAGNOSIS — M25561 Pain in right knee: Secondary | ICD-10-CM | POA: Diagnosis not present

## 2022-08-12 DIAGNOSIS — K219 Gastro-esophageal reflux disease without esophagitis: Secondary | ICD-10-CM | POA: Diagnosis not present

## 2022-08-12 DIAGNOSIS — Z6829 Body mass index (BMI) 29.0-29.9, adult: Secondary | ICD-10-CM | POA: Diagnosis not present

## 2022-08-12 DIAGNOSIS — I10 Essential (primary) hypertension: Secondary | ICD-10-CM | POA: Diagnosis not present

## 2022-08-12 DIAGNOSIS — H02823 Cysts of right eye, unspecified eyelid: Secondary | ICD-10-CM | POA: Diagnosis not present

## 2022-08-18 DIAGNOSIS — R2689 Other abnormalities of gait and mobility: Secondary | ICD-10-CM | POA: Diagnosis not present

## 2022-08-18 DIAGNOSIS — R531 Weakness: Secondary | ICD-10-CM | POA: Diagnosis not present

## 2022-08-18 DIAGNOSIS — M25561 Pain in right knee: Secondary | ICD-10-CM | POA: Diagnosis not present

## 2022-08-20 DIAGNOSIS — R2689 Other abnormalities of gait and mobility: Secondary | ICD-10-CM | POA: Diagnosis not present

## 2022-08-20 DIAGNOSIS — M25561 Pain in right knee: Secondary | ICD-10-CM | POA: Diagnosis not present

## 2022-08-20 DIAGNOSIS — R531 Weakness: Secondary | ICD-10-CM | POA: Diagnosis not present

## 2022-08-25 DIAGNOSIS — R531 Weakness: Secondary | ICD-10-CM | POA: Diagnosis not present

## 2022-08-25 DIAGNOSIS — M25561 Pain in right knee: Secondary | ICD-10-CM | POA: Diagnosis not present

## 2022-08-25 DIAGNOSIS — R2689 Other abnormalities of gait and mobility: Secondary | ICD-10-CM | POA: Diagnosis not present

## 2022-09-02 DIAGNOSIS — R2689 Other abnormalities of gait and mobility: Secondary | ICD-10-CM | POA: Diagnosis not present

## 2022-09-02 DIAGNOSIS — M25561 Pain in right knee: Secondary | ICD-10-CM | POA: Diagnosis not present

## 2022-09-02 DIAGNOSIS — R531 Weakness: Secondary | ICD-10-CM | POA: Diagnosis not present

## 2022-09-04 DIAGNOSIS — R972 Elevated prostate specific antigen [PSA]: Secondary | ICD-10-CM | POA: Diagnosis not present

## 2022-09-04 DIAGNOSIS — R351 Nocturia: Secondary | ICD-10-CM | POA: Diagnosis not present

## 2022-09-04 DIAGNOSIS — N130 Hydronephrosis with ureteropelvic junction obstruction: Secondary | ICD-10-CM | POA: Diagnosis not present

## 2022-09-04 DIAGNOSIS — N411 Chronic prostatitis: Secondary | ICD-10-CM | POA: Diagnosis not present

## 2022-09-09 DIAGNOSIS — L299 Pruritus, unspecified: Secondary | ICD-10-CM | POA: Diagnosis not present

## 2022-09-09 DIAGNOSIS — L821 Other seborrheic keratosis: Secondary | ICD-10-CM | POA: Diagnosis not present

## 2022-09-09 DIAGNOSIS — L120 Bullous pemphigoid: Secondary | ICD-10-CM | POA: Diagnosis not present

## 2022-09-09 DIAGNOSIS — L280 Lichen simplex chronicus: Secondary | ICD-10-CM | POA: Diagnosis not present

## 2022-09-10 DIAGNOSIS — R531 Weakness: Secondary | ICD-10-CM | POA: Diagnosis not present

## 2022-09-10 DIAGNOSIS — R2689 Other abnormalities of gait and mobility: Secondary | ICD-10-CM | POA: Diagnosis not present

## 2022-09-10 DIAGNOSIS — M25561 Pain in right knee: Secondary | ICD-10-CM | POA: Diagnosis not present

## 2022-09-15 DIAGNOSIS — R2689 Other abnormalities of gait and mobility: Secondary | ICD-10-CM | POA: Diagnosis not present

## 2022-09-15 DIAGNOSIS — R531 Weakness: Secondary | ICD-10-CM | POA: Diagnosis not present

## 2022-09-15 DIAGNOSIS — M25561 Pain in right knee: Secondary | ICD-10-CM | POA: Diagnosis not present

## 2022-09-17 ENCOUNTER — Other Ambulatory Visit: Payer: Self-pay | Admitting: Orthopaedic Surgery

## 2022-09-17 ENCOUNTER — Ambulatory Visit: Payer: Medicare PPO | Admitting: Orthopaedic Surgery

## 2022-09-17 ENCOUNTER — Ambulatory Visit (HOSPITAL_COMMUNITY)
Admission: RE | Admit: 2022-09-17 | Discharge: 2022-09-17 | Disposition: A | Discharge status: Home / Self Care | Payer: Medicare PPO | Source: Ambulatory Visit | Attending: Orthopaedic Surgery | Admitting: Orthopaedic Surgery

## 2022-09-17 ENCOUNTER — Encounter: Payer: Self-pay | Admitting: Orthopaedic Surgery

## 2022-09-17 VITALS — Ht 72.0 in | Wt 192.0 lb

## 2022-09-17 DIAGNOSIS — M79661 Pain in right lower leg: Secondary | ICD-10-CM

## 2022-09-17 DIAGNOSIS — M7989 Other specified soft tissue disorders: Secondary | ICD-10-CM | POA: Diagnosis not present

## 2022-09-17 DIAGNOSIS — M79604 Pain in right leg: Secondary | ICD-10-CM | POA: Diagnosis not present

## 2022-09-17 MED ORDER — MELOXICAM 7.5 MG PO TABS: 7.5000 mg | ORAL_TABLET | Freq: Every day | ORAL | 1 refills | Status: AC

## 2022-09-17 NOTE — Progress Notes (Signed)
Office Visit Note   Patient: Joshua Farmer           Date of Birth: 08/30/1941           MRN: [id number] Visit Date: 09/17/2022              Requested by: Neale Burly, MD [place],  [place] [address] PCP: Neale Burly, MD   Assessment & Plan: Visit Diagnoses:  1. 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  He needs a Doppler make sure he has not had recurrent DVT.  We will check him back again in 2 weeks and I will call him with the results of the Doppler.  Follow-Up Instructions: No follow-ups on file.   Orders:  Orders Placed This Encounter  Procedures   US Venous Img Lower Unilateral Right (DVT)   No orders of the defined types were placed in this encounter.     Procedures: No procedures performed   Clinical Data: No additional findings.   Subjective: Chief Complaint  Patient presents with   Right Leg - Pain    HPI 81 year old male returns and nondisplaced posterior lateral tibial plateau fracture treated nonoperatively and then had DVT.  He was treated with Coumadin for few months and then has been off.  He has had recent increase in knee swelling and increased pain in his calf with similar symptoms that he had when he developed DVT before.  Does have trace swelling in his opposite left lower extremity but more pitting edema on the right.  Review of Systems all the systems noncontributory.   Objective: Vital Signs: Ht 6' (1.829 m)   Wt 192 lb (87.1 kg)   BMI 26.04 kg/m   Physical Exam Constitutional:      Appearance: He is well-developed.  HENT:     Head: Normocephalic and atraumatic.     Right Ear: External ear normal.     Left Ear: External ear normal.  Eyes:     Pupils: Pupils are equal, round, and reactive to light.  Neck:     Thyroid: No thyromegaly.     Trachea: No tracheal deviation.  Cardiovascular:     Rate and Rhythm: Normal  rate.  Pulmonary:     Effort: Pulmonary effort is normal.     Breath sounds: No wheezing.  Abdominal:     General: Bowel sounds are normal.     Palpations: Abdomen is soft.  Musculoskeletal:     Cervical back: Neck supple.  Skin:    General: Skin is warm and dry.     Capillary Refill: Capillary refill takes less than 2 seconds.  Neurological:     Mental Status: He is alert and oriented to person, place, and time.  Psychiatric:        Behavior: Behavior normal.        Thought Content: Thought content normal.        Judgment: Judgment normal.     Ortho Exam positive Homans on the right.  2-3+ knee effusion.  No palpable Baker's cyst.  Increased pitting edema left versus right.  Pulses are normal.  Sensations intact lower extremities negative logroll the hips.  Specialty Comments:  No specialty comments available.  Imaging: No results found.   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  Past Surgical History:  Procedure Laterality Date   APPENDECTOMY     CYSTOSCOPY W/ URETERAL STENT PLACEMENT Left 03/25/2020   Procedure: CYSTOSCOPY WITH RETROGRADE PYELOGRAM/URETERAL STENT PLACEMENT;  Surgeon: Franchot Gallo, MD;  Location: [facility];  Service: Urology;  Laterality: Left;  [place] PYELOPLASTY Left 08/02/2020   Procedure: XI ROBOTIC ASSISTED LEFT PYELOPLASTY WITH STENT EXCHANGE;  Surgeon: Ardis Hughs, MD;  Location: [facility];  Service: Urology;  Laterality: Left;   Social History   Occupational History   Not on file  Tobacco Use   Smoking status: Never   Smokeless  tobacco: Never  Vaping Use   Vaping Use: Never used  Substance and Sexual Activity   Alcohol use: No   Drug use: No   Sexual activity: Not on file

## 2022-09-30 ENCOUNTER — Ambulatory Visit: Payer: Medicare PPO | Admitting: Orthopaedic Surgery

## 2022-09-30 DIAGNOSIS — R351 Nocturia: Secondary | ICD-10-CM | POA: Diagnosis not present

## 2022-09-30 DIAGNOSIS — N401 Enlarged prostate with lower urinary tract symptoms: Secondary | ICD-10-CM | POA: Diagnosis not present

## 2022-09-30 DIAGNOSIS — R972 Elevated prostate specific antigen [PSA]: Secondary | ICD-10-CM | POA: Diagnosis not present

## 2022-09-30 DIAGNOSIS — R350 Frequency of micturition: Secondary | ICD-10-CM | POA: Diagnosis not present

## 2022-10-01 ENCOUNTER — Encounter: Payer: Self-pay | Admitting: Orthopaedic Surgery

## 2022-10-01 ENCOUNTER — Ambulatory Visit: Payer: Medicare PPO | Admitting: Orthopaedic Surgery

## 2022-10-01 VITALS — Ht 72.0 in | Wt 192.0 lb

## 2022-10-01 DIAGNOSIS — M659 Synovitis and tenosynovitis, unspecified: Secondary | ICD-10-CM

## 2022-10-01 DIAGNOSIS — S82141S Displaced bicondylar fracture of right tibia, sequela: Secondary | ICD-10-CM | POA: Diagnosis not present

## 2022-10-01 MED ORDER — LIDOCAINE HCL 1 % IJ SOLN
0.5000 mL | INTRAMUSCULAR | Status: AC | PRN
  Administered 2022-10-01: .5 mL

## 2022-10-01 MED ORDER — METHYLPREDNISOLONE ACETATE 40 MG/ML IJ SUSP
40.0000 mg | INTRAMUSCULAR | Status: AC | PRN
  Administered 2022-10-01: 40 mg via INTRA_ARTICULAR

## 2022-10-01 MED ORDER — BUPIVACAINE HCL 0.25 % IJ SOLN
4.0000 mL | INTRAMUSCULAR | Status: AC | PRN
  Administered 2022-10-01: 4 mL via INTRA_ARTICULAR

## 2022-10-01 NOTE — Progress Notes (Signed)
Office Visit Note   Patient: Joshua Farmer           Date of Birth: 1940/11/09           MRN: [id number] Visit Date: 10/01/2022              Requested by: Neale Burly, MD [place],  [place] [address] PCP: Neale Burly, MD   Assessment & Plan: Visit Diagnoses:  1. Synovitis of right knee   2. Closed fracture of right tibial plateau, sequela     Plan: 50 cc aspirated and then cortisone injection into his knee tolerated procedure well.  He has persistent problems he can return.  Follow-Up Instructions: No follow-ups on file.   Orders:  Orders Placed This Encounter  Procedures   Large Joint Inj: R knee   No orders of the defined types were placed in this encounter.     Procedures: Large Joint Inj: R knee on 10/01/2022 1:44 PM Indications: pain and joint swelling Details: 22 G 1.5 in needle, anterolateral approach  Arthrogram: No  Medications: 40 mg methylPREDNISolone acetate 40 MG/ML; 0.5 mL lidocaine 1 %; 4 mL bupivacaine 0.25 % Aspirate: 50 mL yellow and serous Outcome: tolerated well, no immediate complications Procedure, treatment alternatives, risks and benefits explained, specific risks discussed. Consent was given by the patient. Immediately prior to procedure a time out was called to verify the correct patient, procedure, equipment, support staff and site/side marked as required. Patient was prepped and draped in the usual sterile fashion.       Clinical Data: No additional findings.   Subjective: Chief Complaint  Patient presents with   Right Knee - Pain, Follow-up    HPI 81 year old male follow-up post fall with posterior lateral tibial plateau fracture May 15, 2022 which did not require surgery.  He has had persistent pain and swelling in his right knee mostly stiffness but is walking better.  He does have significant effusion.  He has been on Coumadin in the past for DVT last Doppler test was negative for DVT.  Review of Systems  updated unchanged   Objective: Vital Signs: Ht 6' (1.829 m)   Wt 192 lb (87.1 kg)   BMI 26.04 kg/m   Physical Exam Constitutional:      Appearance: He is well-developed.  HENT:     Head: Normocephalic and atraumatic.     Right Ear: External ear normal.     Left Ear: External ear normal.  Eyes:     Pupils: Pupils are equal, round, and reactive to light.  Neck:     Thyroid: No thyromegaly.     Trachea: No tracheal deviation.  Cardiovascular:     Rate and Rhythm: Normal rate.  Pulmonary:     Effort: Pulmonary effort is normal.     Breath sounds: No wheezing.  Abdominal:     General: Bowel sounds are normal.     Palpations: Abdomen is soft.  Musculoskeletal:     Cervical back: Neck supple.  Skin:    General: Skin is warm and dry.     Capillary Refill: Capillary refill takes less than 2 seconds.  Neurological:     Mental Status: He is alert and oriented to person, place, and time.  Psychiatric:        Behavior: Behavior normal.        Thought Content: Thought content normal.        Judgment: Judgment normal.     Ortho  Exam hip range of motion is normal no pitting edema ankle range of motion is normal pulses normal negative Homan.  Patient does have 3-4+ knee effusion.  Specialty Comments:  No specialty comments available.  Imaging: No results found.   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  Past Surgical History:  Procedure Laterality Date   APPENDECTOMY     CYSTOSCOPY W/ URETERAL STENT PLACEMENT Left 03/25/2020   Procedure: CYSTOSCOPY WITH RETROGRADE PYELOGRAM/URETERAL STENT  PLACEMENT;  Surgeon: Franchot Gallo, MD;  Location: [facility];  Service: Urology;  Laterality: Left;  [place] PYELOPLASTY Left 08/02/2020   Procedure: XI ROBOTIC ASSISTED LEFT PYELOPLASTY WITH STENT EXCHANGE;  Surgeon: Ardis Hughs, MD;  Location: [facility];  Service: Urology;  Laterality: Left;   Social History   Occupational History   Not on file  Tobacco Use   Smoking status: Never   Smokeless tobacco: Never  Vaping Use   Vaping Use: Never used  Substance and Sexual Activity   Alcohol use: No   Drug use: No   Sexual activity: Not on file

## 2022-10-20 DIAGNOSIS — R350 Frequency of micturition: Secondary | ICD-10-CM | POA: Diagnosis not present

## 2022-10-20 DIAGNOSIS — R3915 Urgency of urination: Secondary | ICD-10-CM | POA: Diagnosis not present

## 2022-10-29 DIAGNOSIS — N401 Enlarged prostate with lower urinary tract symptoms: Secondary | ICD-10-CM | POA: Diagnosis not present

## 2022-10-29 DIAGNOSIS — R3912 Poor urinary stream: Secondary | ICD-10-CM | POA: Diagnosis not present

## 2022-10-29 DIAGNOSIS — R3915 Urgency of urination: Secondary | ICD-10-CM | POA: Diagnosis not present

## 2022-10-29 DIAGNOSIS — N138 Other obstructive and reflux uropathy: Secondary | ICD-10-CM | POA: Diagnosis not present

## 2022-10-30 ENCOUNTER — Encounter (HOSPITAL_COMMUNITY): Payer: Self-pay

## 2022-10-30 DIAGNOSIS — I10 Essential (primary) hypertension: Secondary | ICD-10-CM | POA: Insufficient documentation

## 2022-10-30 DIAGNOSIS — J189 Pneumonia, unspecified organism: Secondary | ICD-10-CM | POA: Diagnosis not present

## 2022-10-30 DIAGNOSIS — J029 Acute pharyngitis, unspecified: Secondary | ICD-10-CM | POA: Diagnosis not present

## 2022-10-30 DIAGNOSIS — Z79899 Other long term (current) drug therapy: Secondary | ICD-10-CM | POA: Insufficient documentation

## 2022-10-30 DIAGNOSIS — D72829 Elevated white blood cell count, unspecified: Secondary | ICD-10-CM | POA: Diagnosis not present

## 2022-10-30 DIAGNOSIS — R059 Cough, unspecified: Secondary | ICD-10-CM | POA: Diagnosis not present

## 2022-10-30 DIAGNOSIS — J168 Pneumonia due to other specified infectious organisms: Secondary | ICD-10-CM | POA: Diagnosis not present

## 2022-10-30 DIAGNOSIS — R509 Fever, unspecified: Secondary | ICD-10-CM | POA: Diagnosis not present

## 2022-10-30 DIAGNOSIS — R3911 Hesitancy of micturition: Secondary | ICD-10-CM | POA: Diagnosis not present

## 2022-10-30 DIAGNOSIS — I517 Cardiomegaly: Secondary | ICD-10-CM | POA: Diagnosis not present

## 2022-10-30 NOTE — ED Triage Notes (Signed)
Pt reports he was seen at urgent care earlier today and was diagnosed with Pneumonia.  He received an injection of rocephin and was sent home on oral doxycycline and cefdinir, albuterol inhaler and told if he didn't feel better by this evening to come to the ER.

## 2022-10-31 ENCOUNTER — Emergency Department (HOSPITAL_COMMUNITY)
Admission: EM | Admit: 2022-10-31 | Discharge: 2022-10-31 | Disposition: A | Discharge status: Home / Self Care | Payer: Medicare PPO | Attending: Emergency Medicine | Admitting: Emergency Medicine

## 2022-10-31 ENCOUNTER — Emergency Department (HOSPITAL_COMMUNITY): Payer: Medicare PPO

## 2022-10-31 DIAGNOSIS — J189 Pneumonia, unspecified organism: Secondary | ICD-10-CM

## 2022-10-31 DIAGNOSIS — R059 Cough, unspecified: Secondary | ICD-10-CM | POA: Diagnosis not present

## 2022-10-31 LAB — CBC WITH DIFFERENTIAL/PLATELET
Abs Immature Granulocytes: 0.06 10*3/uL (ref 0.00–0.07)
Basophils Absolute: 0.1 10*3/uL (ref 0.0–0.1)
Basophils Relative: 0 %
Eosinophils Absolute: 0 10*3/uL (ref 0.0–0.5)
Eosinophils Relative: 0 %
HCT: 39.9 % (ref 39.0–52.0)
Hemoglobin: 12.7 g/dL — ABNORMAL LOW (ref 13.0–17.0)
Immature Granulocytes: 0 %
Lymphocytes Relative: 12 %
Lymphs Abs: 1.7 10*3/uL (ref 0.7–4.0)
MCH: 30.6 pg (ref 26.0–34.0)
MCHC: 31.8 g/dL (ref 30.0–36.0)
MCV: 96.1 fL (ref 80.0–100.0)
Monocytes Absolute: 1.6 10*3/uL — ABNORMAL HIGH (ref 0.1–1.0)
Monocytes Relative: 11 %
Neutro Abs: 10.6 10*3/uL — ABNORMAL HIGH (ref 1.7–7.7)
Neutrophils Relative %: 77 %
Platelets: 253 10*3/uL (ref 150–400)
RBC: 4.15 MIL/uL — ABNORMAL LOW (ref 4.22–5.81)
RDW: 14.4 % (ref 11.5–15.5)
WBC: 14 10*3/uL — ABNORMAL HIGH (ref 4.0–10.5)
nRBC: 0 % (ref 0.0–0.2)

## 2022-10-31 LAB — BASIC METABOLIC PANEL
Anion gap: 7 (ref 5–15)
BUN: 38 mg/dL — ABNORMAL HIGH (ref 8–23)
CO2: 21 mmol/L — ABNORMAL LOW (ref 22–32)
Calcium: 9.9 mg/dL (ref 8.9–10.3)
Chloride: 114 mmol/L — ABNORMAL HIGH (ref 98–111)
Creatinine, Ser: 3.14 mg/dL — ABNORMAL HIGH (ref 0.61–1.24)
GFR, Estimated: 19 mL/min — ABNORMAL LOW (ref >60)
Glucose, Bld: 109 mg/dL — ABNORMAL HIGH (ref 70–99)
Potassium: 4.6 mmol/L (ref 3.5–5.1)
Sodium: 142 mmol/L (ref 135–145)

## 2022-10-31 MED ORDER — SODIUM CHLORIDE 0.9 % IV BOLUS
1000.0000 mL | Freq: Once | INTRAVENOUS | Status: AC
  Administered 2022-10-31: 1000 mL via INTRAVENOUS

## 2022-10-31 NOTE — ED Provider Notes (Signed)
[facility] EMERGENCY DEPARTMENT Provider Note   CSN: [id number] Arrival date & time: 10/30/22  1848     History  Chief Complaint  Patient presents with   Cough    Joshua Farmer is a 81 y.o. male.  Patient is a 81 year old male with past medical history of hypertension, hyperlipidemia, GERD.  Patient presenting today with complaints of cough.  This started approximately 2 days ago.  He describes cough that is intermittently productive of yellow sputum.  He denies any fevers, chills, chest pain, or difficulty breathing.  He was seen by his primary doctor today and was told he had pneumonia based on his x-ray findings.  He was given Rocephin and discharged with a cephalosporin and doxycycline.  He was told if he was not feeling better by this evening, he should come to the ER for IV fluids and further evaluation.  Patient tells me he feels about the same.  The history is provided by the patient.       Home Medications Prior to Admission medications   Medication Sig Start Date End Date Taking? Authorizing Provider  albuterol (VENTOLIN HFA) 108 (90 Base) MCG/ACT inhaler Inhale 2 puffs into the lungs every 6 (six) hours as needed for wheezing or shortness of breath. 02/09/19   [provider]  alfuzosin (UROXATRAL) 10 MG 24 hr tablet Take 10 mg by mouth daily with breakfast.    [provider]  amLODipine (NORVASC) 5 MG tablet Take 5 mg by mouth daily. 11/22/19   [provider]  atorvastatin (LIPITOR) 40 MG tablet Take 40 mg by mouth at bedtime.  07/16/19   [provider]  chlorthalidone (HYGROTON) 25 MG tablet Take 25 mg by mouth daily. Patient not taking: Reported on 11/10/2021 02/05/20   [provider]  furosemide (LASIX) 20 MG tablet Take 20 mg by mouth daily.    [provider]  HYDROcodone-acetaminophen (NORCO/VICODIN) 5-325 MG tablet Take 1 tablet by mouth every 6 (six) hours as needed for moderate pain. 05/15/22   Persons, Bevely Palmer, PA  lidocaine (LIDODERM) 5 % Place 1 patch onto the skin daily as needed. Apply patch to area most significant pain once per day.  Remove and discard patch within 12 hours of application. 01/14/21   Petrucelli, Aldona Bar R, PA-C  meloxicam (MOBIC) 7.5 MG tablet Take 1 tablet (7.5 mg total) by mouth daily. 09/17/22   Marybelle Killings, MD  Multiple Vitamins-Minerals (ONE-A-DAY MENS 50+) TABS Take 1 tablet by mouth daily.    [provider]  tamsulosin (FLOMAX) 0.4 MG CAPS capsule Take 1 capsule (0.4 mg total) by mouth at bedtime. 11/10/21   Isla Pence, MD      Allergies    Patient has no known allergies.    Review of Systems   Review of Systems  All other systems reviewed and are negative.   Physical Exam Updated Vital Signs BP (!) 166/84 (BP Location: Right Arm)   Pulse 95   Temp 99.1 F (37.3 C) (Oral)   Resp 16   Ht 6' (1.829 m)   Wt 85.7 kg   SpO2 99%   BMI 25.63 kg/m  Physical Exam Vitals and nursing note reviewed.  Constitutional:      General: He is not in acute distress.    Appearance: He is well-developed. He is not diaphoretic.  HENT:     Head: Normocephalic and atraumatic.  Cardiovascular:     Rate and Rhythm: Normal rate and regular rhythm.  Heart sounds: No murmur heard.    No friction rub.  Pulmonary:     Effort: Pulmonary effort is normal. No respiratory distress.     Breath sounds: Normal breath sounds. No wheezing or rales.  Abdominal:     General: Bowel sounds are normal. There is no distension.     Palpations: Abdomen is soft.     Tenderness: There is no abdominal tenderness.  Musculoskeletal:        General: Normal range of motion.     Cervical back: Normal range of motion and neck supple.  Skin:    General: Skin is warm and dry.  Neurological:     Mental Status: He is alert and oriented to person, place, and time.     Coordination: Coordination normal.     ED Results / Procedures / Treatments   Labs (all labs ordered are  listed, but only abnormal results are displayed) Labs Reviewed - No data to display  EKG None  Radiology No results found.  Procedures Procedures    Medications Ordered in ED Medications  sodium chloride 0.9 % bolus 1,000 mL (has no administration in time range)    ED Course/ Medical Decision Making/ A&P  Patient presenting here with cough for 2 days.  He was seen by his primary doctor earlier this morning and told to come to the ER if he was not feeling better by this evening.  He was told he had pneumonia and given antibiotics.  Patient arrives here afebrile with stable vital signs.  There is no hypoxia.  He is clinically well-appearing and in no distress.  Heart and lung exam is unremarkable.  Abdomen is benign.  Workup initiated including CBC and basic metabolic panel.  These were both unremarkable.  Chest x-ray showing only scarring in the left base, but no acute infiltrate.  At this point, patient appears well and I believe can safely be discharged.  I will have him continue the medications prescribed by his primary doctor and follow-up as needed.  He was given 1 L of normal saline here in the ER and is feeling better.  Final Clinical Impression(s) / ED Diagnoses Final diagnoses:  None    Rx / DC Orders ED Discharge Orders     None         Veryl Speak, MD 10/31/22 0400

## 2022-10-31 NOTE — Discharge Instructions (Signed)
Continue medications as previously prescribed.  Return to the ER if symptoms significantly worsen or change. 

## 2022-11-02 DEATH — deceased

## 2022-11-03 DIAGNOSIS — J208 Acute bronchitis due to other specified organisms: Secondary | ICD-10-CM | POA: Diagnosis not present

## 2022-11-03 DIAGNOSIS — Z6829 Body mass index (BMI) 29.0-29.9, adult: Secondary | ICD-10-CM | POA: Diagnosis not present

## 2022-11-03 DIAGNOSIS — I10 Essential (primary) hypertension: Secondary | ICD-10-CM | POA: Diagnosis not present

## 2022-11-12 ENCOUNTER — Other Ambulatory Visit: Payer: Self-pay | Admitting: Urology

## 2022-11-17 DIAGNOSIS — Z0000 Encounter for general adult medical examination without abnormal findings: Secondary | ICD-10-CM | POA: Diagnosis not present

## 2022-11-17 DIAGNOSIS — Z6829 Body mass index (BMI) 29.0-29.9, adult: Secondary | ICD-10-CM | POA: Diagnosis not present

## 2022-11-17 DIAGNOSIS — I10 Essential (primary) hypertension: Secondary | ICD-10-CM | POA: Diagnosis not present

## 2022-11-17 DIAGNOSIS — N184 Chronic kidney disease, stage 4 (severe): Secondary | ICD-10-CM | POA: Diagnosis not present

## 2022-11-17 DIAGNOSIS — N400 Enlarged prostate without lower urinary tract symptoms: Secondary | ICD-10-CM | POA: Diagnosis not present

## 2022-11-23 NOTE — Patient Instructions (Addendum)
SURGICAL WAITING ROOM VISITATION Patients having surgery or a procedure may have no more than 2 support people in the waiting area - these visitors may rotate in the visitor waiting room.   Due to an increase in RSV and influenza rates and associated hospitalizations, children ages 10 and under may not visit patients in [facility]. If the patient needs to stay at the hospital during part of their recovery, the visitor guidelines for inpatient rooms apply.  PRE-OP VISITATION  Pre-op nurse will coordinate an appropriate time for 1 support person to accompany the patient in pre-op.  This support person may not rotate.  This visitor will be contacted when the time is appropriate for the visitor to come back in the pre-op area.  Please refer to the [facility] website for the visitor guidelines for Inpatients (after your surgery is over and you are in a regular room).  You are not required to quarantine at this time prior to your surgery. However, you must do this: Hand Hygiene often Do NOT share personal items Notify your provider if you are in close contact with someone who has COVID or you develop fever 100.4 or greater, new onset of sneezing, cough, sore throat, shortness of breath or body aches.  If you test positive for Covid or have been in contact with anyone that has tested positive in the last 10 days please notify you surgeon.    Your procedure is scheduled on:  Wednesday  December 02, 2022  Report to [facility] Main Entrance: [address] entrance where the [employer] is available.   Report to admitting at: 07:15 AM  +++++Call this number if you have any questions or problems the morning of surgery [phone]  DO NOT EAT OR DRINK ANYTHING AFTER MIDNIGHT THE NIGHT PRIOR TO YOUR SURGERY / PROCEDURE.   FOLLOW BOWEL PREP AND ANY ADDITIONAL PRE OP INSTRUCTIONS YOU RECEIVED FROM YOUR SURGEON'S OFFICE!!!   Oral Hygiene is also important to reduce your risk of  infection.        Remember - BRUSH YOUR TEETH THE MORNING OF SURGERY WITH YOUR REGULAR TOOTHPASTE  Do NOT smoke after Midnight the night before surgery.  Take ONLY these medicines the morning of surgery with A SIP OF WATER: Amlodipine, Alfuzosin (Uroxatral)   If You have been diagnosed with Sleep Apnea - Bring CPAP mask and tubing day of surgery. We will provide you with a CPAP machine on the day of your surgery.                   You may not have any metal on your body including  jewelry, and body piercing  Do not wear  lotions, powders,  cologne, or deodorant  Men may shave face and neck.  Contacts, Hearing Aids, dentures or bridgework may not be worn into surgery. DENTURES WILL BE REMOVED PRIOR TO SURGERY PLEASE DO NOT APPLY "Poly grip" OR ADHESIVES!!!  You may bring a small overnight bag with you on the day of surgery, only pack items that are not valuable. [facility] IS NOT RESPONSIBLE   FOR VALUABLES THAT ARE LOST OR STOLEN.   Do not bring your home medications to the hospital. The Pharmacy will dispense medications listed on your medication list to you during your admission in the Hospital.  Special Instructions: Bring a copy of your healthcare power of attorney and living will documents the day of surgery, if you wish to have them scanned into your [facility]  Records- EPIC  Please read over the following fact sheets you were given: IF YOU HAVE QUESTIONS ABOUT YOUR PRE-OP INSTRUCTIONS, PLEASE CALL [phone]  ([place])   [facility] - Preparing for Surgery Before surgery, you can play an important role.  Because skin is not sterile, your skin needs to be as free of germs as possible.  You can reduce the number of germs on your skin by washing with CHG (chlorahexidine gluconate) soap before surgery.  CHG is an antiseptic cleaner which kills germs and bonds with the skin to continue killing germs even after washing. Please DO NOT use if you have an allergy to CHG or  antibacterial soaps.  If your skin becomes reddened/irritated stop using the CHG and inform your nurse when you arrive at Short Stay. Do not shave (including legs and underarms) for at least 48 hours prior to the first CHG shower.  You may shave your face/neck.  Please follow these instructions carefully:  1.  Shower with CHG Soap the night before surgery and the  morning of surgery.  2.  If you choose to wash your hair, wash your hair first as usual with your normal  shampoo.  3.  After you shampoo, rinse your hair and body thoroughly to remove the shampoo.                             4.  Use CHG as you would any other liquid soap.  You can apply chg directly to the skin and wash.  Gently with a scrungie or clean washcloth.  5.  Apply the CHG Soap to your body ONLY FROM THE NECK DOWN.   Do not use on face/ open                           Wound or open sores. Avoid contact with eyes, ears mouth and genitals (private parts).                       Wash face,  Genitals (private parts) with your normal soap.             6.  Wash thoroughly, paying special attention to the area where your  surgery  will be performed.  7.  Thoroughly rinse your body with warm water from the neck down.  8.  DO NOT shower/wash with your normal soap after using and rinsing off the CHG Soap.            9.  Pat yourself dry with a clean towel.            10.  Wear clean pajamas.            11.  Place clean sheets on your bed the night of your first shower and do not  sleep with pets.  ON THE DAY OF SURGERY : Do not apply any lotions/deodorants the morning of surgery.  Please wear clean clothes to the hospital/surgery center.    FAILURE TO FOLLOW THESE INSTRUCTIONS MAY RESULT IN THE CANCELLATION OF YOUR SURGERY  PATIENT SIGNATURE_________________________________  NURSE SIGNATURE__________________________________  ________________________________________________________________________

## 2022-11-23 NOTE — Progress Notes (Signed)
COVID Vaccine received:  _0  No _1  Yes Date of any COVID positive Test in last 90 days:  PCP-   Stoney Bang, MD -  Clearance on chart Cardiologist - none  Chest x-ray -  EKG -  11-10-2021   will repeat at PST Stress Test -  ECHO -  Cardiac Cath -   PCR screen: _2  Ordered & Completed                      _3   No Order but Needs PROFEND                      _4   N/A for this surgery  Surgery Plan:  _5  Ambulatory                            _6  Outpatient in bed                            _7  Admit  Anesthesia:    _8  General  _9  Spinal                           _10   Choice _11   MAC  Bowel Prep - _12  No  _13   Yes _____________  Pacemaker / ICD device _14  No _15  Yes        Device order form faxed _16  No    _17   Yes      Faxed to:  Spinal Cord Stimulator:_18  No _19  Yes      (Remind patient to bring remote DOS) Other Implants:   History of Sleep Apnea? _20  No _21  Yes   CPAP used?- _22  No _23  Yes    Does the patient monitor blood sugar? _24  No _25  Yes  _26  N/A  Blood Thinner / Instructions:None Aspirin Instructions:  ASA 71m???  ERAS Protocol Ordered: _27  No  _28  Yes  Patient is to be NPO after: Midnight prior  [place] Activity level: Patient can / can not climb a flight of stairs without difficulty; _29  No CP  _30  No SOB, but would have ______   Patient can / can not perform ADLs without assistance.   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  Patient verbalized understanding and agreement to the Pre-Surgical Instructions that were given to them at this PAT appointment. Patient was also educated of the need to review these PAT instructions again prior to his/her surgery.I reviewed the appropriate phone numbers to call if they have any and questions or concerns.

## 2022-11-25 ENCOUNTER — Other Ambulatory Visit: Payer: Self-pay

## 2022-11-25 ENCOUNTER — Encounter (HOSPITAL_COMMUNITY): Payer: Self-pay

## 2022-11-25 ENCOUNTER — Encounter (HOSPITAL_COMMUNITY)
Admission: RE | Admit: 2022-11-25 | Discharge: 2022-11-25 | Disposition: A | Discharge status: Home / Self Care | Payer: Medicare PPO | Source: Ambulatory Visit | Attending: Urology | Admitting: Urology

## 2022-11-25 VITALS — BP 148/70 | HR 63 | Temp 98.1°F | Resp 14 | Ht 72.0 in | Wt 191.0 lb

## 2022-11-25 DIAGNOSIS — I10 Essential (primary) hypertension: Secondary | ICD-10-CM

## 2022-11-25 DIAGNOSIS — Z01818 Encounter for other preprocedural examination: Secondary | ICD-10-CM | POA: Insufficient documentation

## 2022-11-25 HISTORY — DX: Sleep apnea, unspecified: G47.30

## 2022-11-25 HISTORY — DX: Chronic kidney disease, unspecified: N18.9

## 2022-11-25 LAB — CBC
HCT: 39.1 % (ref 39.0–52.0)
Hemoglobin: 12.2 g/dL — ABNORMAL LOW (ref 13.0–17.0)
MCH: 30.3 pg (ref 26.0–34.0)
MCHC: 31.2 g/dL (ref 30.0–36.0)
MCV: 97 fL (ref 80.0–100.0)
Platelets: 285 10*3/uL (ref 150–400)
RBC: 4.03 MIL/uL — ABNORMAL LOW (ref 4.22–5.81)
RDW: 14.2 % (ref 11.5–15.5)
WBC: 7.1 10*3/uL (ref 4.0–10.5)
nRBC: 0 % (ref 0.0–0.2)

## 2022-11-25 LAB — BASIC METABOLIC PANEL
Anion gap: 8 (ref 5–15)
BUN: 43 mg/dL — ABNORMAL HIGH (ref 8–23)
CO2: 23 mmol/L (ref 22–32)
Calcium: 10.3 mg/dL (ref 8.9–10.3)
Chloride: 113 mmol/L — ABNORMAL HIGH (ref 98–111)
Creatinine, Ser: 2.98 mg/dL — ABNORMAL HIGH (ref 0.61–1.24)
GFR, Estimated: 21 mL/min — ABNORMAL LOW (ref >60)
Glucose, Bld: 94 mg/dL (ref 70–99)
Potassium: 5 mmol/L (ref 3.5–5.1)
Sodium: 144 mmol/L (ref 135–145)

## 2022-12-02 ENCOUNTER — Observation Stay (HOSPITAL_COMMUNITY)
Admission: RE | Admit: 2022-12-02 | Discharge: 2022-12-03 | Disposition: A | Discharge status: Home / Self Care | Payer: Medicare PPO | Attending: Urology | Admitting: Urology

## 2022-12-02 ENCOUNTER — Encounter (HOSPITAL_COMMUNITY)
Admission: RE | Disposition: A | Discharge status: Home / Self Care | Payer: Self-pay | Source: Home / Self Care | Attending: Urology

## 2022-12-02 ENCOUNTER — Encounter (HOSPITAL_COMMUNITY): Payer: Self-pay | Admitting: Urology

## 2022-12-02 ENCOUNTER — Other Ambulatory Visit: Payer: Self-pay

## 2022-12-02 ENCOUNTER — Ambulatory Visit (HOSPITAL_BASED_OUTPATIENT_CLINIC_OR_DEPARTMENT_OTHER): Payer: Medicare PPO | Admitting: Certified Registered Nurse Anesthetist

## 2022-12-02 ENCOUNTER — Ambulatory Visit (HOSPITAL_COMMUNITY): Payer: Medicare PPO | Admitting: Certified Registered Nurse Anesthetist

## 2022-12-02 DIAGNOSIS — Z79899 Other long term (current) drug therapy: Secondary | ICD-10-CM | POA: Insufficient documentation

## 2022-12-02 DIAGNOSIS — N401 Enlarged prostate with lower urinary tract symptoms: Principal | ICD-10-CM | POA: Insufficient documentation

## 2022-12-02 DIAGNOSIS — N138 Other obstructive and reflux uropathy: Secondary | ICD-10-CM | POA: Diagnosis not present

## 2022-12-02 DIAGNOSIS — I10 Essential (primary) hypertension: Secondary | ICD-10-CM | POA: Diagnosis not present

## 2022-12-02 DIAGNOSIS — R338 Other retention of urine: Secondary | ICD-10-CM | POA: Diagnosis not present

## 2022-12-02 DIAGNOSIS — N289 Disorder of kidney and ureter, unspecified: Secondary | ICD-10-CM

## 2022-12-02 DIAGNOSIS — G4730 Sleep apnea, unspecified: Secondary | ICD-10-CM

## 2022-12-02 DIAGNOSIS — N320 Bladder-neck obstruction: Secondary | ICD-10-CM | POA: Diagnosis not present

## 2022-12-02 DIAGNOSIS — Z7982 Long term (current) use of aspirin: Secondary | ICD-10-CM | POA: Insufficient documentation

## 2022-12-02 HISTORY — PX: TRANSURETHRAL RESECTION OF PROSTATE: SHX73

## 2022-12-02 SURGERY — TURP (TRANSURETHRAL RESECTION OF PROSTATE)
Anesthesia: General

## 2022-12-02 MED ORDER — FENTANYL CITRATE (PF) 100 MCG/2ML IJ SOLN
INTRAMUSCULAR | Status: AC
  Filled 2022-12-02: qty 2

## 2022-12-02 MED ORDER — SODIUM CHLORIDE 0.9 % IR SOLN
3000.0000 mL | Status: DC
  Administered 2022-12-02: 3000 mL

## 2022-12-02 MED ORDER — PROPOFOL 10 MG/ML IV BOLUS
INTRAVENOUS | Status: DC | PRN
  Administered 2022-12-02: 50 mg via INTRAVENOUS
  Administered 2022-12-02: 150 mg via INTRAVENOUS

## 2022-12-02 MED ORDER — CIPROFLOXACIN IN D5W 400 MG/200ML IV SOLN
INTRAVENOUS | Status: AC
  Filled 2022-12-02: qty 200

## 2022-12-02 MED ORDER — OXYCODONE HCL 5 MG/5ML PO SOLN: 5.0000 mg | Freq: Once | ORAL | Status: DC | PRN

## 2022-12-02 MED ORDER — SODIUM CHLORIDE 0.45 % IV SOLN: INTRAVENOUS | Status: DC

## 2022-12-02 MED ORDER — FENTANYL CITRATE PF 50 MCG/ML IJ SOSY: 25.0000 ug | PREFILLED_SYRINGE | INTRAMUSCULAR | Status: DC | PRN

## 2022-12-02 MED ORDER — ACETAMINOPHEN 325 MG PO TABS: 325.0000 mg | ORAL_TABLET | ORAL | Status: DC | PRN

## 2022-12-02 MED ORDER — LACTATED RINGERS IV SOLN: INTRAVENOUS | Status: DC

## 2022-12-02 MED ORDER — ACETAMINOPHEN 160 MG/5ML PO SOLN: 325.0000 mg | ORAL | Status: DC | PRN

## 2022-12-02 MED ORDER — CHLORHEXIDINE GLUCONATE 0.12 % MT SOLN
15.0000 mL | Freq: Once | OROMUCOSAL | Status: AC
  Administered 2022-12-02: 15 mL via OROMUCOSAL

## 2022-12-02 MED ORDER — LIDOCAINE HCL (PF) 2 % IJ SOLN
INTRAMUSCULAR | Status: AC
  Filled 2022-12-02: qty 5

## 2022-12-02 MED ORDER — ORAL CARE MOUTH RINSE: 15.0000 mL | Freq: Once | OROMUCOSAL | Status: AC

## 2022-12-02 MED ORDER — CEFAZOLIN SODIUM-DEXTROSE 1-4 GM/50ML-% IV SOLN: 1.0000 g | Freq: Three times a day (TID) | INTRAVENOUS | Status: DC

## 2022-12-02 MED ORDER — ACETAMINOPHEN 10 MG/ML IV SOLN
1000.0000 mg | Freq: Once | INTRAVENOUS | Status: AC
  Administered 2022-12-02: 1000 mg via INTRAVENOUS
  Filled 2022-12-02: qty 100

## 2022-12-02 MED ORDER — ORAL CARE MOUTH RINSE: 15.0000 mL | OROMUCOSAL | Status: DC | PRN

## 2022-12-02 MED ORDER — ONDANSETRON HCL 4 MG/2ML IJ SOLN
INTRAMUSCULAR | Status: AC
  Filled 2022-12-02: qty 2

## 2022-12-02 MED ORDER — CIPROFLOXACIN IN D5W 400 MG/200ML IV SOLN
400.0000 mg | INTRAVENOUS | Status: AC
  Administered 2022-12-02: 400 mg via INTRAVENOUS

## 2022-12-02 MED ORDER — ATORVASTATIN CALCIUM 40 MG PO TABS
40.0000 mg | ORAL_TABLET | Freq: Every day | ORAL | Status: DC
  Administered 2022-12-02: 40 mg via ORAL
  Filled 2022-12-02: qty 1

## 2022-12-02 MED ORDER — ALBUTEROL SULFATE (2.5 MG/3ML) 0.083% IN NEBU: 3.0000 mL | INHALATION_SOLUTION | Freq: Four times a day (QID) | RESPIRATORY_TRACT | Status: DC | PRN

## 2022-12-02 MED ORDER — OXYCODONE HCL 5 MG PO TABS: 5.0000 mg | ORAL_TABLET | Freq: Once | ORAL | Status: DC | PRN

## 2022-12-02 MED ORDER — SODIUM CHLORIDE 0.9 % IR SOLN
Status: DC | PRN
  Administered 2022-12-02: 15000 mL
  Administered 2022-12-02: 6000 mL

## 2022-12-02 MED ORDER — ACETAMINOPHEN 500 MG PO TABS: 1000.0000 mg | ORAL_TABLET | Freq: Once | ORAL | Status: DC

## 2022-12-02 MED ORDER — FENTANYL CITRATE (PF) 100 MCG/2ML IJ SOLN
INTRAMUSCULAR | Status: DC | PRN
  Administered 2022-12-02 (×8): 25 ug via INTRAVENOUS

## 2022-12-02 MED ORDER — ACETAMINOPHEN 10 MG/ML IV SOLN: 1000.0000 mg | Freq: Once | INTRAVENOUS | Status: DC | PRN

## 2022-12-02 MED ORDER — METOPROLOL SUCCINATE ER 25 MG PO TB24
25.0000 mg | ORAL_TABLET | Freq: Every day | ORAL | Status: DC
  Administered 2022-12-02: 25 mg via ORAL
  Filled 2022-12-02: qty 1

## 2022-12-02 MED ORDER — LIDOCAINE 2% (20 MG/ML) 5 ML SYRINGE
INTRAMUSCULAR | Status: DC | PRN
  Administered 2022-12-02: 40 mg via INTRAVENOUS

## 2022-12-02 MED ORDER — CEFAZOLIN SODIUM-DEXTROSE 1-4 GM/50ML-% IV SOLN
1.0000 g | Freq: Three times a day (TID) | INTRAVENOUS | Status: AC
  Administered 2022-12-02 – 2022-12-03 (×3): 1 g via INTRAVENOUS
  Filled 2022-12-02 (×3): qty 50

## 2022-12-02 MED ORDER — DEXAMETHASONE SODIUM PHOSPHATE 10 MG/ML IJ SOLN
INTRAMUSCULAR | Status: AC
  Filled 2022-12-02: qty 1

## 2022-12-02 MED ORDER — ONDANSETRON HCL 4 MG/2ML IJ SOLN
INTRAMUSCULAR | Status: DC | PRN
  Administered 2022-12-02: 4 mg via INTRAVENOUS

## 2022-12-02 MED ORDER — TRAMADOL HCL 50 MG PO TABS
50.0000 mg | ORAL_TABLET | Freq: Four times a day (QID) | ORAL | Status: DC | PRN
  Administered 2022-12-02: 50 mg via ORAL
  Filled 2022-12-02: qty 1

## 2022-12-02 MED ORDER — PROMETHAZINE HCL 25 MG/ML IJ SOLN: 6.2500 mg | INTRAMUSCULAR | Status: DC | PRN

## 2022-12-02 MED ORDER — DEXAMETHASONE SODIUM PHOSPHATE 10 MG/ML IJ SOLN
INTRAMUSCULAR | Status: DC | PRN
  Administered 2022-12-02: 4 mg via INTRAVENOUS

## 2022-12-02 MED ORDER — PANTOPRAZOLE SODIUM 40 MG PO TBEC
40.0000 mg | DELAYED_RELEASE_TABLET | Freq: Every day | ORAL | Status: DC
  Administered 2022-12-02 – 2022-12-03 (×2): 40 mg via ORAL
  Filled 2022-12-02 (×2): qty 1

## 2022-12-02 MED ORDER — PRAZOSIN HCL 1 MG PO CAPS
2.0000 mg | ORAL_CAPSULE | Freq: Every day | ORAL | Status: DC
  Administered 2022-12-02: 2 mg via ORAL
  Filled 2022-12-02: qty 2

## 2022-12-02 SURGICAL SUPPLY — 21 items
BAG URINE DRAIN 2000ML AR STRL (UROLOGICAL SUPPLIES) ×1 IMPLANT
BAG URO CATCHER STRL LF (MISCELLANEOUS) ×1 IMPLANT
CATH FOL 3WAY LX 20X30 (CATHETERS) IMPLANT
CATH FOLEY 3WAY 30CC 22FR (CATHETERS) IMPLANT
CATH FOLEY 3WAY 30CC 24FR (CATHETERS)
CATH URTH STD 24FR FL 3W 2 (CATHETERS) IMPLANT
DRAPE FOOT SWITCH (DRAPES) ×1 IMPLANT
ELECT REM PT RETURN 15FT ADLT (MISCELLANEOUS) ×1 IMPLANT
GLOVE BIO SURGEON STRL SZ7.5 (GLOVE) ×1 IMPLANT
GOWN STRL REUS W/ TWL XL LVL3 (GOWN DISPOSABLE) ×1 IMPLANT
GOWN STRL REUS W/TWL XL LVL3 (GOWN DISPOSABLE) ×1
HOLDER FOLEY CATH W/STRAP (MISCELLANEOUS) IMPLANT
KIT TURNOVER KIT A (KITS) IMPLANT
LOOP CUT BIPOLAR 24F LRG (ELECTROSURGICAL) IMPLANT
MANIFOLD NEPTUNE II (INSTRUMENTS) ×1 IMPLANT
PACK CYSTO (CUSTOM PROCEDURE TRAY) ×1 IMPLANT
SYR 30ML LL (SYRINGE) IMPLANT
SYR TOOMEY IRRIG 70ML (MISCELLANEOUS) ×1
SYRINGE TOOMEY IRRIG 70ML (MISCELLANEOUS) ×1 IMPLANT
TUBING CONNECTING 10 (TUBING) ×1 IMPLANT
TUBING UROLOGY SET (TUBING) ×1 IMPLANT

## 2022-12-02 NOTE — Anesthesia Preprocedure Evaluation (Addendum)
Anesthesia Evaluation  Patient identified by MRN, date of birth, ID band Patient awake    Reviewed: Allergy & Precautions, NPO status , Patient's Chart, lab work & pertinent test results, reviewed documented beta blocker date and time   Airway Mallampati: II  TM Distance: >3 FB Neck ROM: Full    Dental  (+) Teeth Intact, Dental Advisory Given   Pulmonary sleep apnea    breath sounds clear to auscultation       Cardiovascular hypertension, Pt. on home beta blockers  Rhythm:Regular Rate:Normal     Neuro/Psych negative neurological ROS  negative psych ROS   GI/Hepatic Neg liver ROS,GERD  Medicated,,  Endo/Other  negative endocrine ROS    Renal/GU Renal disease     Musculoskeletal negative musculoskeletal ROS (+)    Abdominal   Peds  Hematology negative hematology ROS (+)   Anesthesia Other Findings   Reproductive/Obstetrics                             Anesthesia Physical Anesthesia Plan  ASA: 3  Anesthesia Plan: General   Post-op Pain Management: Tylenol PO (pre-op)*   Induction: Intravenous  PONV Risk Score and Plan: 3 and Ondansetron, Dexamethasone and Midazolam  Airway Management Planned: LMA  Additional Equipment: None  Intra-op Plan:   Post-operative Plan: Extubation in OR  Informed Consent: I have reviewed the patients History and Physical, chart, labs and discussed the procedure including the risks, benefits and alternatives for the proposed anesthesia with the patient or authorized representative who has indicated his/her understanding and acceptance.     Dental advisory given  Plan Discussed with: CRNA  Anesthesia Plan Comments:        Anesthesia Quick Evaluation

## 2022-12-02 NOTE — Op Note (Signed)
Preoperative diagnosis:  Bladder outlet obstruction Urinary retention  Postoperative diagnosis:  same   Procedure:  Transurethral resection of prostate  Surgeon: Ardis Hughs, MD   Anesthesia: General   Complications: None   Intraoperative findings: Cystoscopy demonstrated a normal appearing bladder mucosa without tumor/stones/or abnormalities.  UOs were orthopic.  Prostate demonstrated a high median bar with lateral lobe obstruction.  EBL: 100cc   Specimens: Prostate chips  Indication: Joshua Farmer is a 82 y.o. patient with bladder outlet obstruction/urinary retention/obstructive voiding symptoms.  After reviewing the management options for treatment, he elected to proceed with the above surgical procedure(s). We have discussed the potential benefits and risks of the procedure, side effects of the proposed treatment, the likelihood of the patient achieving the goals of the procedure, and any potential problems that might occur during the procedure or recuperation. Informed consent has been obtained.  Description of procedure:  The patient was taken to the operating room and general anesthesia was induced. The patient was placed in the dorsal lithotomy position, prepped and draped in the usual sterile fashion, and preoperative antibiotics were administered. A preoperative time-out was performed.   I then gently passed the 21 French 30 cystoscope into the patient's urethra and up into the bladder under visual guidance. A 360 cystoscopic evaluation was then performed with the above findings.  I then removed the 21 French cystoscopic sheath and replacement with a 26 French resectoscope sheath was passed and using the visual obturator under direct vision. An exchange the obturator for the resectoscope itself and the loop cautery. I then proceeded to create grooves within the prostate at the 7:00 position extending the defect from the bladder neck at 7:00 down to the prostate apex just  lateral to the verumontanum. I took this groove down to the capsule. I then performed a similar maneuver on the patient's left side at the 5:00 position taking the prostate down from the bladder neck to the apex and down the prostatic capsule. At this point I proceeded to resect the patient's right lateral lobe in a systematic fashion moving from the 7:00 position up to approximately 11. The resection was taken down to the prostate capsule. Hemostasis was achieved on this side prior to moving to the patient's left lateral lobe. A similar sequence was performed taking the prostate down from the 5:00 position to approximately the 1:00 position to the level of the prostatic capsule. I then completed the resection of the posterior wall as well as the area between 5:00 and 7:00 along the bladder neck. I was careful not to undermine the bladder neck or resect the inferior. I then did resect the area that was protruding into the prostatic urethra anteriorly.   Once I was satisfied that the prostate had been adequately resected I evacuated the prostate chips using a Toomey syringe. I then reintroduced the resectoscope and ensured adequate hemostasis. I then left the bladder full and removed the resectoscope entirely. Exam under anesthesia demonstrated a normal sized prostate with no nodules.  I then placed a 22 French three-way Foley catheter. I irrigated the catheter gently and removed all debris and clots. I then placed the patient on gentle continuous bladder irrigation.  He subsequently extubated and returned to PACU in excellent condition.  Ardis Hughs, MD

## 2022-12-02 NOTE — Transfer of Care (Signed)
Immediate Anesthesia Transfer of Care Note  Patient: DAINE GUNTHER  Procedure(s) Performed: TRANSURETHRAL RESECTION OF THE PROSTATE (TURP)  Patient Location: PACU  Anesthesia Type:General  Level of Consciousness: drowsy and patient cooperative  Airway & Oxygen Therapy: Patient Spontanous Breathing and Patient connected to face mask oxygen  Post-op Assessment: Report given to RN and Post -op Vital signs reviewed and stable  Post vital signs: Reviewed and stable  Last Vitals:  Vitals Value Taken Time  BP 139/87   Temp    Pulse 80 12/02/22 1108  Resp 20 12/02/22 1108  SpO2 99 % 12/02/22 1108  Vitals shown include unvalidated device data.  Last Pain:  Vitals:   12/02/22 0704  TempSrc:   PainSc: 0-No pain         Complications: No notable events documented.

## 2022-12-02 NOTE — H&P (Signed)
Obstructive voiding symptoms   09/30/22: Joshua Farmer returns today in f/u. He has a history of a left pyeloplasty in 9/21 and BPH with BOO and OAB. He was treated for prostatitis earlier this month and had improvement in the LUTS but now he is back with increased frequency q1-2 hrs during the day with nocturia x3-4. He has urgency and can have UUI. His IPSS is 12. He remains on lasix at bedtime. He remains on tamsulosin 0.'8mg'$  daily and Myrbetriq '50mg'$  daily. He did no better with Gemtesa or solifenacin. He has no dysuria or hematuria. His PSA feel from 4.4 to 2.27 with the Cipro.   Urodynamics: The patient was noted to have a very large capacity bladder with overactive component. He had some associated urge incontinence he had a very weak stream when he was able to void. He was able to generate an adequate detrusor pressure. He had a very elevated PVR, with some trabeculation and an elevation of the bladder base.   Interval: Today the patient is here for further discussion of his voiding symptoms and urodynamics. The urine effects demonstrated an obstructed bladder but a very weak detrusor. He had an elevated PVR. Continues to have severe frequency and urgency. His stream is gotten worse since starting the trospium.     ALLERGIES: None   MEDICATIONS: Aspirin 81 mg tablet,chewable  Myrbetriq 50 mg tablet, extended release 24 hr 1 tablet PO Daily  Nexium 20 mg capsule,delayed release  Tamsulosin Hcl 0.4 mg capsule 2 capsule PO Daily  Amlodipine Besylate 5 mg tablet  Atorvastatin Calcium 40 mg tablet  Chlorthalidone 25 mg tablet  Prazosin Hcl 2 mg capsule  Senna 8.6 mg tablet  Sodium Bicarbonate 650 mg tablet  Trospium Chloride 20 mg tablet 1 tablet PO Q HS  Tylenol Extra Strength 500 mg tablet  Zzzquil 50 mg/30 ml liquid     GU PSH: Complex cystometrogram, w/ void pressure and urethral pressure profile studies, any technique - 10/20/2022 Complex Uroflow - 10/20/2022 Cysto Remove Stent FB Sim -  2021 Cystoscopy Insert Stent, Left - 2021, Left - 2021 Cystoscopy Ureteroscopy, Left - 2021 Emg surf Electrd - 10/20/2022 Inject For cystogram - 10/20/2022 Intrabd voidng Press - 10/20/2022 Pyeloplasty (laparoscopic/robotic), Left - 2021       PSH Notes: thumb   NON-GU PSH: Anesth, Catheterize Heart Appendectomy     GU PMH: Urinary Frequency - 10/20/2022, - 09/30/2022 Urinary Urgency - 10/20/2022, (Stable), - 09/30/2022, - 02/02/2022 (Stable), - 10/02/2021, Overactive bladder. Symptoms are moderate, bothersome enough to want to be on a medication. He has been on medical therapy before. I don't think he has any real obstruction issues., - 2018 BPH w/LUTS, He has increased frequency with urgency and UUI on tamsulosin and Myrbetriq. I am going to try to add Trospium '20mg'$  po qhs which is the dose for his GFR of 29 and will get him set up for urodynamics to better clarify his voiding symptoms. He will f/u with Dr. Louis Meckel. - 09/30/2022 Elevated PSA, His PSA normalized after Cipro. 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 Will culture. Poss UTI though he may also just be having some bladder spasms. , - 2021 Renal atrophy - 2021, - 2021  History of urolithiasis - 2021 Renal cyst - 2021 ED due to arterial insufficiency, He would like a prescription for sildenafil - 2018 Hydrocele, Asymptomatic left hydrocele - 2018    NON-GU PMH: Anxiety COPD Depression GERD Hypercholesterolemia Hypertension Pulmonary Embolism, History Sleep Apnea    FAMILY HISTORY: Brain tumor - Mother Heart Disease - Sister Hematuria - Runs in Family Hypertension - Father Kidney Cancer - Mother leukemia -  Mother Prostate Cancer - Brother Seizure - Sister   SOCIAL HISTORY: Marital Status: Married Preferred Language: English; Ethnicity: Not Hispanic Or Latino; Race: Black or African American Current Smoking Status: Patient has never smoked.   Tobacco Use Assessment Completed: Used Tobacco in last 30 days? Does not use smokeless tobacco. Has never drank.  Does not use drugs. Drinks 1 caffeinated drink per day. Patient's occupation is/was retired.     Notes: 2 daughters   REVIEW OF SYSTEMS:    GU Review Male:   Patient reports trouble starting your stream and have to strain to urinate . Patient denies frequent urination, hard to postpone urination, burning/ pain with urination, get up at night to urinate, leakage of urine, stream starts and stops, erection problems, and penile pain.  Gastrointestinal (Upper):   Patient denies nausea, vomiting, and indigestion/ heartburn.  Gastrointestinal (Lower):   Patient reports constipation. Patient denies diarrhea.  Constitutional:   Patient denies fever, night sweats, weight loss, and fatigue.  Skin:   Patient denies skin rash/ lesion and itching.  Eyes:   Patient denies blurred vision and double vision.  Ears/ Nose/ Throat:   Patient denies sore throat and sinus problems.  Hematologic/Lymphatic:   Patient denies swollen glands and easy bruising.  Cardiovascular:   Patient denies leg swelling and chest pains.  Respiratory:   Patient denies shortness of breath and cough.  Endocrine:   Patient denies excessive thirst.  Musculoskeletal:   Patient denies back pain and joint pain.  Neurological:   Patient denies headaches and dizziness.  Psychologic:   Patient denies depression and anxiety.   Notes: Small voids    VITAL SIGNS: None   MULTI-SYSTEM PHYSICAL EXAMINATION:    Constitutional: Well-nourished. No physical deformities. Normally developed. Good grooming.  Respiratory: Normal breath sounds. No labored breathing, no use of accessory muscles.    Cardiovascular: Regular rate and rhythm. No murmur, no gallop. Normal temperature, normal extremity pulses, no swelling, no varicosities.      Complexity of Data:  Source Of History:  Patient  Lab Test Review:   PSA  Records Review:   Previous Doctor Records, Previous Patient Records, POC Tool  Urine Test Review:   Urinalysis  Urodynamics Review:   Review Urodynamics Tests   09/04/22 08/03/22  PSA  Total PSA 2.27 ng/mL 4.4 ng/ml    PROCEDURES:         PVR Ultrasound - 51798  Scanned Volume: 36 cc         Urinalysis w/Scope Dipstick Dipstick Cont'd Micro  Color: Yellow Bilirubin: Neg mg/dL WBC/hpf: 10 - 20/hpf  Appearance: Slightly Cloudy Ketones: Neg mg/dL RBC/hpf: 0 - 2/hpf  Specific Gravity: 1.020 Blood: Neg ery/uL Bacteria: Rare (0-9/hpf)  pH: 5.5 Protein: 2+ mg/dL Cystals: NS (Not Seen)  Glucose: Neg mg/dL Urobilinogen: 0.2 mg/dL Casts: NS (Not Seen)    Nitrites: Neg Trichomonas: Not Present    Leukocyte Esterase: Trace leu/uL Mucous: Not Present      Epithelial Cells: NS (Not Seen)      Yeast: NS (Not Seen)  Sperm: Not Present    ASSESSMENT:      ICD-10 Details  1 GU:   BPH w/LUTS - N40.1   2   Urinary Obstruction - N13.8   3   Weak Urinary Stream - R39.12   4   Urinary Urgency - R39.15    PLAN:           Schedule Return Visit/Planned Activity: ASAP - Schedule Surgery          Document Letter(s):  Created for Patient: Clinical Summary         Notes:   The patient I discussed his urodynamics as well as his voiding symptoms. His biggest issue is getting up at night and his urgency and frequency. We discussed management strategies and I recommended bladder outlet procedure. We discussed several different ones including office-based ones. Ultimately, we have opted to proceed with a TURP. I explained this to the patient in detail. I went through the rationale as well as the risk and benefits. Having gone through all of that he is opted to proceed   We do want  to get clearance by his primary care doctor prior to proceeding to ensure that he does not need any additional workup or optimization prior to his TURP.

## 2022-12-02 NOTE — Interval H&P Note (Signed)
History and Physical Interval Note:  12/02/2022 9:28 AM  Joshua Farmer  has presented today for surgery, with the diagnosis of BLADDER OUTLET OBSTRUCTION.  The various methods of treatment have been discussed with the patient and family. After consideration of risks, benefits and other options for treatment, the patient has consented to  Procedure(s) with comments: [place] (TURP) (N/A) - 90 MINUTES NEEDED FOR CASE as a surgical intervention.  The patient's history has been reviewed, patient examined, no change in status, stable for surgery.  I have reviewed the patient's chart and labs.  Questions were answered to the patient's satisfaction.     Ardis Hughs

## 2022-12-02 NOTE — Discharge Instructions (Signed)

## 2022-12-02 NOTE — Anesthesia Procedure Notes (Signed)
Procedure Name: LMA Insertion Date/Time: 12/02/2022 9:51 AM  Performed by: Genelle Bal, CRNAPre-anesthesia Checklist: Patient identified, Emergency Drugs available, Suction available and Patient being monitored Patient Re-evaluated:Patient Re-evaluated prior to induction Oxygen Delivery Method: Circle system utilized Preoxygenation: Pre-oxygenation with 100% oxygen Induction Type: IV induction Ventilation: Mask ventilation without difficulty LMA: LMA with gastric port inserted LMA Size: 4.0 Number of attempts: 1 Airway Equipment and Method: Bite block Placement Confirmation: positive ETCO2 Tube secured with: Tape Dental Injury: Teeth and Oropharynx as per pre-operative assessment

## 2022-12-02 NOTE — Progress Notes (Signed)
  Transition of Care [facility]) Screening Note   Patient Details  Name: Joshua Farmer Date of Birth: 01-29-1941   Transition of Care [facility]) CM/SW Contact:    Dessa Phi, RN Phone Number: 12/02/2022, 2:32 PM    Transition of Care Department [facility]) has reviewed patient and no TOC needs have been identified at this time. We will continue to monitor patient advancement through interdisciplinary progression rounds. If new patient transition needs arise, please place a TOC consult.

## 2022-12-03 ENCOUNTER — Encounter (HOSPITAL_COMMUNITY): Payer: Self-pay | Admitting: Urology

## 2022-12-03 DIAGNOSIS — I10 Essential (primary) hypertension: Secondary | ICD-10-CM | POA: Diagnosis not present

## 2022-12-03 DIAGNOSIS — Z79899 Other long term (current) drug therapy: Secondary | ICD-10-CM | POA: Diagnosis not present

## 2022-12-03 DIAGNOSIS — N401 Enlarged prostate with lower urinary tract symptoms: Secondary | ICD-10-CM | POA: Diagnosis not present

## 2022-12-03 DIAGNOSIS — R338 Other retention of urine: Secondary | ICD-10-CM | POA: Diagnosis not present

## 2022-12-03 DIAGNOSIS — N138 Other obstructive and reflux uropathy: Secondary | ICD-10-CM | POA: Diagnosis not present

## 2022-12-03 DIAGNOSIS — Z7982 Long term (current) use of aspirin: Secondary | ICD-10-CM | POA: Diagnosis not present

## 2022-12-03 LAB — SURGICAL PATHOLOGY

## 2022-12-03 MED ORDER — TRAMADOL HCL 50 MG PO TABS: 50.0000 mg | ORAL_TABLET | Freq: Four times a day (QID) | ORAL | 0 refills | Status: AC | PRN

## 2022-12-03 NOTE — Progress Notes (Signed)
Foley catheter was removed this morning per order. Will continue to monitor for any voiding issues.

## 2022-12-03 NOTE — Anesthesia Postprocedure Evaluation (Addendum)
Anesthesia Post Note  Patient: Joshua Farmer  Procedure(s) Performed: TRANSURETHRAL RESECTION OF THE PROSTATE (TURP)     Patient location during evaluation: PACU Anesthesia Type: General Level of consciousness: awake and alert Pain management: pain level controlled Vital Signs Assessment: post-procedure vital signs reviewed and stable Respiratory status: spontaneous breathing, nonlabored ventilation, respiratory function stable and patient connected to nasal cannula oxygen Cardiovascular status: blood pressure returned to baseline and stable Postop Assessment: no apparent nausea or vomiting Anesthetic complications: no   No notable events documented.               Effie Berkshire

## 2022-12-03 NOTE — Plan of Care (Signed)
  Problem: Education: Goal: Knowledge of General Education information will improve Description: Including pain rating scale, medication(s)/side effects and non-pharmacologic comfort measures Outcome: Adequate for Discharge   Problem: Clinical Measurements: Goal: Diagnostic test results will improve Outcome: Adequate for Discharge   Problem: Pain Managment: Goal: General experience of comfort will improve Outcome: Adequate for Discharge   Problem: Safety: Goal: Ability to remain free from injury will improve Outcome: Adequate for Discharge

## 2022-12-03 NOTE — Discharge Summary (Signed)
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 Briefly, Joshua Farmer is a 82 y.o. year old patient with severe voiding symptoms and urodynamics to suggest bladder outlet obstruction.   Hospital Course: Patient tolerated the procedure well.  He was then transferred to the floor after an uneventful PACU stay.  His hospital course was uncomplicated.  On POD#1 he had met discharge criteria: was eating a regular diet, was up and ambulating independently,  pain was well controlled, was voiding without a catheter, and was ready to for discharge.  Physical exam on day of discharge: NAD Vitals:   12/02/22 2045 12/03/22 0014 12/03/22 0451 12/03/22 0810  BP: 138/82 111/72 124/80 (!) 144/76  Pulse: 84 78 70 64  Resp: '18 16 20 18  '$ Temp: 97.8 F (36.6 C) 97.9 F (36.6 C) 97.8 F (36.6 C) (!) 97.5 F (36.4 C)  TempSrc: Oral Oral Oral Oral  SpO2: 95% 93% 97% 99%  Weight:      Height:        Intake/Output Summary (Last 24 hours) at 12/03/2022 1036 Last data filed at 12/03/2022 0901 Gross per 24 hour  Intake 2007.68 ml  Output 2925 ml  Net -917.32 ml  Nonlabored breathing No suprapubic tenderness, catheter has been removed No lower extremity edema     Laboratory values:  No results for input(s): "WBC", "HGB", "HCT" in the last 72 hours. No results for input(s): "NA", "K", "CL", "CO2", "GLUCOSE", "BUN", "CREATININE", "CALCIUM" in the last 72  hours. No results for input(s): "LABPT", "INR" in the last 72 hours. No results for input(s): "LABURIN" in the last 72 hours. Results for orders placed or performed during the hospital encounter of 11/09/21  Resp Panel by RT-PCR (Flu A&B, Covid) Nasopharyngeal Swab     Status: None   Collection Time: 11/10/21  8:14 AM   Specimen: Nasopharyngeal Swab; Nasopharyngeal(NP) swabs in vial transport medium  Result Value Ref Range Status   SARS Coronavirus 2 by RT PCR NEGATIVE NEGATIVE Final    Comment: (NOTE) SARS-CoV-2 target nucleic acids are NOT DETECTED.  The SARS-CoV-2 RNA is generally detectable in upper respiratory specimens during the acute phase of infection. The lowest concentration of SARS-CoV-2 viral copies this assay can detect is 138 copies/mL. A negative result does not preclude SARS-Cov-2 infection and should not be used as the sole basis for treatment or other patient management decisions. A negative result may occur with  improper specimen collection/handling, submission of specimen other than nasopharyngeal swab, presence of viral mutation(s) within the areas targeted by this assay, and inadequate number of viral copies(<138 copies/mL). A negative result must be combined with clinical observations, patient history, and epidemiological information. The expected result is Negative.  Fact Sheet for Patients:  EntrepreneurPulse.com.au  Fact Sheet for Healthcare Providers:  IncredibleEmployment.be  This test is no t yet approved or cleared by the [place] FDA and  has been authorized for detection and/or diagnosis of SARS-CoV-2 by FDA under an Emergency Use Authorization (EUA). This EUA will  remain  in effect (meaning this test can be used) for the duration of the COVID-19 declaration under Section 564(b)(1) of the Act, 21 U.S.C.section 360bbb-3(b)(1), unless the authorization is terminated  or revoked sooner.       Influenza A  by PCR NEGATIVE NEGATIVE Final   Influenza B by PCR NEGATIVE NEGATIVE Final    Comment: (NOTE) The Xpert Xpress SARS-CoV-2/FLU/RSV plus assay is intended as an aid in the diagnosis of influenza from Nasopharyngeal swab specimens and should not be used as a sole basis for treatment. Nasal washings and aspirates are unacceptable for Xpert Xpress SARS-CoV-2/FLU/RSV testing.  Fact Sheet for Patients: EntrepreneurPulse.com.au  Fact Sheet for Healthcare Providers: IncredibleEmployment.be  This test is not yet approved or cleared by the [place] FDA and has been authorized for detection and/or diagnosis of SARS-CoV-2 by FDA under an Emergency Use Authorization (EUA). This EUA will remain in effect (meaning this test can be used) for the duration of the COVID-19 declaration under Section 564(b)(1) of the Act, 21 U.S.C. section 360bbb-3(b)(1), unless the authorization is terminated or revoked.  Performed at [facility], [place] [address], [place], [place] [address]     Disposition: Home  Discharge instruction: The patient was instructed to be ambulatory but told to refrain from heavy lifting, strenuous activity, or driving.   Discharge medications:  Allergies as of 12/03/2022       Reactions   Trospium Chloride Other (See Comments)   Stopped urine out put        Medication List     TAKE these medications    albuterol 108 (90 Base) MCG/ACT inhaler Commonly known as: VENTOLIN HFA Inhale 2 puffs into the lungs every 6 (six) hours as needed for wheezing or shortness of breath.   aspirin EC 325 MG tablet Take 325 mg by mouth daily.   atorvastatin 40 MG tablet Commonly known as: LIPITOR Take 40 mg by mouth at bedtime.   clobetasol ointment 0.05 % Commonly known as: TEMOVATE Apply 1 Application topically 2 (two) times daily as needed (itching).   meloxicam 7.5 MG tablet Commonly known as: Mobic Take 1 tablet (7.5 mg total) by  mouth daily.   metoprolol succinate 25 MG 24 hr tablet Commonly known as: TOPROL-XL Take 25 mg by mouth at bedtime.   One-A-Day Mens 50+ Tabs Take 1 tablet by mouth daily.   pantoprazole 40 MG tablet Commonly known as: PROTONIX Take 40 mg by mouth daily.   prazosin 2 MG capsule Commonly known as: MINIPRESS Take 2 mg by mouth at bedtime.   promethazine-dextromethorphan 6.25-15 MG/5ML syrup Commonly known as: PROMETHAZINE-DM Take 5 mLs by mouth every 4 (four) hours as needed for cough.   traMADol 50 MG tablet Commonly known as: ULTRAM Take 1 tablet (50 mg total) by mouth every 6 (six) hours as needed for moderate pain.   triamcinolone cream 0.1 % Commonly known as: KENALOG Apply 1 Application topically 2 (two) times daily as needed (rash).        Followup:   Follow-up Information     Karen Kays, NP Follow up on 12/18/2022.   Specialty: Nurse Practitioner Why: 10am Contact information: [address] [place] [address] [phone]

## 2022-12-07 DIAGNOSIS — Z6829 Body mass index (BMI) 29.0-29.9, adult: Secondary | ICD-10-CM | POA: Diagnosis not present

## 2022-12-07 DIAGNOSIS — N400 Enlarged prostate without lower urinary tract symptoms: Secondary | ICD-10-CM | POA: Diagnosis not present

## 2022-12-07 DIAGNOSIS — K5900 Constipation, unspecified: Secondary | ICD-10-CM | POA: Diagnosis not present

## 2022-12-18 DIAGNOSIS — N401 Enlarged prostate with lower urinary tract symptoms: Secondary | ICD-10-CM | POA: Diagnosis not present

## 2022-12-18 DIAGNOSIS — R351 Nocturia: Secondary | ICD-10-CM | POA: Diagnosis not present

## 2023-03-02 DIAGNOSIS — D23111 Other benign neoplasm of skin of right upper eyelid, including canthus: Secondary | ICD-10-CM | POA: Diagnosis not present

## 2023-03-03 DIAGNOSIS — I10 Essential (primary) hypertension: Secondary | ICD-10-CM | POA: Diagnosis not present

## 2023-03-03 DIAGNOSIS — K219 Gastro-esophageal reflux disease without esophagitis: Secondary | ICD-10-CM | POA: Diagnosis not present

## 2023-03-03 DIAGNOSIS — N184 Chronic kidney disease, stage 4 (severe): Secondary | ICD-10-CM | POA: Diagnosis not present

## 2023-03-03 DIAGNOSIS — Z0000 Encounter for general adult medical examination without abnormal findings: Secondary | ICD-10-CM | POA: Diagnosis not present

## 2023-03-03 DIAGNOSIS — N400 Enlarged prostate without lower urinary tract symptoms: Secondary | ICD-10-CM | POA: Diagnosis not present

## 2023-03-03 DIAGNOSIS — Z6829 Body mass index (BMI) 29.0-29.9, adult: Secondary | ICD-10-CM | POA: Diagnosis not present

## 2023-03-22 DIAGNOSIS — N401 Enlarged prostate with lower urinary tract symptoms: Secondary | ICD-10-CM | POA: Diagnosis not present

## 2023-03-22 DIAGNOSIS — R350 Frequency of micturition: Secondary | ICD-10-CM | POA: Diagnosis not present

## 2023-04-05 ENCOUNTER — Telehealth: Payer: Self-pay | Admitting: Physical Medicine and Rehabilitation

## 2023-04-05 NOTE — Telephone Encounter (Signed)
Pt called requesting an appt for back/hip pain. Pt need injection. Pt phone number is [phone].

## 2023-04-06 ENCOUNTER — Telehealth: Payer: Self-pay | Admitting: Physical Medicine and Rehabilitation

## 2023-04-06 NOTE — Telephone Encounter (Signed)
Spoke with patient's spouse and left message to return call to schedule appointment. Last seen by Dr. Alvester Morin in 2021

## 2023-04-06 NOTE — Telephone Encounter (Signed)
Patient called asked for a call back concerning scheduling an appointment with Dr. Alvester Morin. The number to contact patient is [phone] or [phone]

## 2023-04-06 NOTE — Telephone Encounter (Signed)
See previous encounter

## 2023-04-08 DIAGNOSIS — D485 Neoplasm of uncertain behavior of skin: Secondary | ICD-10-CM | POA: Diagnosis not present

## 2023-04-08 DIAGNOSIS — Z01818 Encounter for other preprocedural examination: Secondary | ICD-10-CM | POA: Diagnosis not present

## 2023-04-08 DIAGNOSIS — H0279 Other degenerative disorders of eyelid and periocular area: Secondary | ICD-10-CM | POA: Diagnosis not present

## 2023-04-09 ENCOUNTER — Ambulatory Visit: Payer: Medicare PPO | Admitting: Physical Medicine and Rehabilitation

## 2023-04-09 DIAGNOSIS — M47816 Spondylosis without myelopathy or radiculopathy, lumbar region: Secondary | ICD-10-CM | POA: Diagnosis not present

## 2023-04-09 DIAGNOSIS — G8929 Other chronic pain: Secondary | ICD-10-CM

## 2023-04-09 DIAGNOSIS — M545 Low back pain, unspecified: Secondary | ICD-10-CM

## 2023-04-09 NOTE — Progress Notes (Unsigned)
Joshua Farmer - 82 y.o. male MRN [id number]  Date of birth: Dec 12, 1940  Office Visit Note: Visit Date: 04/09/2023 PCP: Toma Deiters, MD Referred by: Toma Deiters, MD  Subjective: Chief Complaint  Patient presents with   Lower Back - Pain   HPI: Joshua Farmer is a 82 y.o. male who comes in today for evaluation of chronic, worsening and severe right sided lower back pain. Patient last seen in our office in 2021. Pain ongoing for 6 months. Pain worsens when standing and laying flat to sleep. He describes pain as sharp and burning, currently rates as 8 out of 10. Some relief of pain with home exercise regimen, rest and use of medications. History of formal physical therapy with short term pain relief. Lumbar MRI imaging from 2021 exhibits multi level disc and facet degeneration. There is severe right foraminal stenosis at L5-S1. Facet arthropathy more prominent at L4-L5 and L5-S1. No high grade spinal canal stenosis noted. No history of lumbar surgery. History of lumbar epidural steroid injections in our office in 2021 for more right sided radicular symptoms. Most recent injection on 02/21/2020 provided significant relief of pain, greater than 50% for over 6 months. Patient remains active, however severe right sided back pain is now negatively impacting ability to function. Patient denies focal weakness, numbness and tingling. No recent trauma or falls.    Review of Systems  Musculoskeletal:  Positive for back pain.  Neurological:  Negative for tingling, sensory change, focal weakness and weakness.  All other systems reviewed and are negative.  Otherwise per HPI.  Assessment & Plan: Visit Diagnoses:    ICD-10-CM   1. Chronic bilateral low back pain without sciatica  M54.50 Ambulatory referral to Physical Medicine Rehab   G89.29     2. Spondylosis without myelopathy or radiculopathy, lumbar region  M47.816 Ambulatory referral to Physical Medicine Rehab    3. Facet arthropathy, lumbar   M47.816 Ambulatory referral to Physical Medicine Rehab       Plan: Findings:  Chronic, worsening and severe right sided lower back pain. No radicular symptoms. Patient continues to have severe pain despite good conservative therapies such as formal physical therapy, home exercise regimen, rest and use of medications. Patients clinical presentation and exam are consistent with facet mediated pain. Next step is to perform diagnostic and hopefully therapeutic right L4-L5 and L5-S1 facet joint injections under fluoroscopic guidance. If his pain persists post facet joint injections we would recommend obtaining new lumbar MRI imaging. Could also look at re-group with formal physical therapy. If his pain declares itself as more radicular we would consider repeating lumbar epidural steroid injection. Patient has no questions regarding injection procedure at this time. No red flag symptoms noted upon exam.    Meds & Orders: No orders of the defined types were placed in this encounter.   Orders Placed This Encounter  Procedures   Ambulatory referral to Physical Medicine Rehab    Follow-up: Return for Right L4-L5 and L5-S1 facet joint injections.   Procedures: No procedures performed      Clinical History: EXAM: MRI LUMBAR SPINE WITHOUT CONTRAST   TECHNIQUE: Multiplanar, multisequence MR imaging of the lumbar spine was performed. No intravenous contrast was administered.   COMPARISON:  Lumbar discogram CT 07/24/2010   FINDINGS: Segmentation:  Standard lumbar numbering   Alignment: Lower lumbar levoscoliosis and upper lumbar dextrocurvature.   Vertebrae:  No fracture, evidence of discitis, or bone lesion.   Conus medullaris and cauda equina: Conus  extends to the L1 level. Conus and cauda equina appear normal.   Paraspinal and other soft tissues: Marked hydronephrosis with cortical thinning on the left. This was not noted on 2019 CT.   Disc levels:   T12- L1: Unremarkable.   L1-L2:  Disc narrowing and foraminal predominant bulging. Negative facets and no impingement   L2-L3: Disc narrowing and bulging with mild facet spurring. The canal and foramina remain patent   L3-L4: Disc narrowing and endplate degeneration with bulging that is asymmetric to the left, where there is also greater facet hypertrophy. Advanced left foraminal stenosis with L3 impingement. Mild spinal stenosis.   L4-L5: Disc narrowing and bulging with posterior element hypertrophy. Moderate biforaminal impingement. Mild spinal stenosis.   L5-S1:Disc narrowing with endplate degeneration and bulging asymmetric to the right. Asymmetric right degenerative facet spurring. Severe right foraminal stenosis L5 compression.   IMPRESSION: 1. 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 2. 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 3. L3-4 high-grade left foraminal impingement. 4. L4-5 moderate bilateral foraminal narrowing. 5. Left hydronephrosis and renal atrophy not described on 2019 abdominal CT, recommend urology follow-up.     Electronically Signed   By: Marnee Spring M.D.   On: 02/01/2020 08:42   He reports that he has never smoked. He has never used smokeless tobacco. No results for input(s): "HGBA1C", "LABURIC" in the last 8760 hours.  Objective:  VS:  HT:    WT:   BMI:     BP:   HR: bpm  TEMP: ( )  RESP:  Physical Exam Vitals and nursing note reviewed.  HENT:     Head: Normocephalic and atraumatic.     Right Ear: External ear normal.     Left Ear: External ear normal.     Nose: Nose normal.     Mouth/Throat:     Mouth: Mucous membranes are moist.  Eyes:     Extraocular Movements: Extraocular movements intact.  Cardiovascular:     Rate and Rhythm: Normal rate.     Pulses: Normal pulses.  Pulmonary:     Effort: Pulmonary effort is normal.  Abdominal:     General: Abdomen is flat. There is no distension.   Musculoskeletal:        General: Tenderness present.     Cervical back: Normal range of motion.     Comments: Patient rises from seated position to standing without difficulty. Concordant low back pain with facet loading, lumbar spine extension and rotation. 5/5 strength noted with bilateral hip flexion, knee flexion/extension, ankle dorsiflexion/plantarflexion and EHL. No clonus noted bilaterally. No pain upon palpation of greater trochanters. No pain with internal/external rotation of bilateral hips. Sensation intact bilaterally. Negative slump test bilaterally. Ambulates without aid, gait steady.    Skin:    General: Skin is warm and dry.     Capillary Refill: Capillary refill takes less than 2 seconds.  Neurological:     General: No focal deficit present.     Mental Status: He is alert and oriented to person, place, and time.  Psychiatric:        Mood and Affect: Mood normal.        Behavior: Behavior normal.     Ortho Exam  Imaging: No results found.  Past Medical/Family/Surgical/Social History: Medications & Allergies reviewed per EMR, new medications updated. 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 Past Surgical History:  Procedure Laterality Date   APPENDECTOMY     CYSTOSCOPY W/ URETERAL STENT PLACEMENT Left 03/25/2020   Procedure: CYSTOSCOPY WITH RETROGRADE PYELOGRAM/URETERAL STENT PLACEMENT;  Surgeon: Marcine Matar, MD;  Location: [facility];  Service: Urology;  Laterality: Left;  30 MINS    ROBOT ASSISTED PYELOPLASTY Left 08/02/2020   Procedure: XI ROBOTIC ASSISTED LEFT PYELOPLASTY WITH STENT EXCHANGE;  Surgeon: Crist Fat, MD;  Location: [facility];  Service: Urology;  Laterality: Left;   TRANSURETHRAL RESECTION OF PROSTATE N/A 12/02/2022   Procedure: TRANSURETHRAL RESECTION OF THE PROSTATE (TURP);  Surgeon: Crist Fat, MD;  Location: [facility];  Service: Urology;  Laterality: N/A;  90 MINUTES NEEDED FOR CASE   Social History   Occupational History   Not on file  Tobacco Use   Smoking status: Never   Smokeless tobacco: Never  Vaping Use   Vaping Use: Never used  Substance and Sexual Activity   Alcohol use: No   Drug use: No   Sexual activity: Not Currently

## 2023-04-09 NOTE — Progress Notes (Unsigned)
Functional Pain Scale - descriptive words and definitions  Unmanageable (7)  Pain interferes with normal ADL's/nothing seems to help/sleep is very difficult/active distractions are very difficult to concentrate on. Severe range order  Average Pain 7  Lower back pain on right that radiates in the right hip

## 2023-04-12 ENCOUNTER — Ambulatory Visit: Payer: Medicare PPO | Admitting: Urology

## 2023-04-12 ENCOUNTER — Encounter: Payer: Self-pay | Admitting: Physical Medicine and Rehabilitation

## 2023-05-18 DIAGNOSIS — R3915 Urgency of urination: Secondary | ICD-10-CM | POA: Diagnosis not present

## 2023-05-18 DIAGNOSIS — N401 Enlarged prostate with lower urinary tract symptoms: Secondary | ICD-10-CM | POA: Diagnosis not present

## 2023-05-20 DIAGNOSIS — H0279 Other degenerative disorders of eyelid and periocular area: Secondary | ICD-10-CM | POA: Diagnosis not present

## 2023-05-20 DIAGNOSIS — D485 Neoplasm of uncertain behavior of skin: Secondary | ICD-10-CM | POA: Diagnosis not present

## 2023-05-20 DIAGNOSIS — Z01818 Encounter for other preprocedural examination: Secondary | ICD-10-CM | POA: Diagnosis not present

## 2023-05-20 DIAGNOSIS — D2310 Other benign neoplasm of skin of unspecified eyelid, including canthus: Secondary | ICD-10-CM | POA: Diagnosis not present

## 2023-06-03 DIAGNOSIS — Z09 Encounter for follow-up examination after completed treatment for conditions other than malignant neoplasm: Secondary | ICD-10-CM | POA: Diagnosis not present

## 2023-06-03 DIAGNOSIS — D485 Neoplasm of uncertain behavior of skin: Secondary | ICD-10-CM | POA: Diagnosis not present

## 2023-06-03 DIAGNOSIS — D23111 Other benign neoplasm of skin of right upper eyelid, including canthus: Secondary | ICD-10-CM | POA: Diagnosis not present

## 2023-06-03 DIAGNOSIS — H0279 Other degenerative disorders of eyelid and periocular area: Secondary | ICD-10-CM | POA: Diagnosis not present

## 2023-06-03 DIAGNOSIS — H01001 Unspecified blepharitis right upper eyelid: Secondary | ICD-10-CM | POA: Diagnosis not present

## 2023-06-19 DIAGNOSIS — U071 COVID-19: Secondary | ICD-10-CM | POA: Diagnosis not present

## 2023-07-22 DIAGNOSIS — D2339 Other benign neoplasm of skin of other parts of face: Secondary | ICD-10-CM | POA: Diagnosis not present

## 2023-08-02 DIAGNOSIS — H01006 Unspecified blepharitis left eye, unspecified eyelid: Secondary | ICD-10-CM | POA: Diagnosis not present

## 2023-08-02 DIAGNOSIS — H0279 Other degenerative disorders of eyelid and periocular area: Secondary | ICD-10-CM | POA: Diagnosis not present

## 2023-08-02 DIAGNOSIS — H01003 Unspecified blepharitis right eye, unspecified eyelid: Secondary | ICD-10-CM | POA: Diagnosis not present

## 2023-08-02 DIAGNOSIS — Z09 Encounter for follow-up examination after completed treatment for conditions other than malignant neoplasm: Secondary | ICD-10-CM | POA: Diagnosis not present

## 2023-08-10 DIAGNOSIS — Z23 Encounter for immunization: Secondary | ICD-10-CM | POA: Diagnosis not present
